# Patient Record
Sex: Female | Born: 1970 | Race: White | Hispanic: No | Marital: Married | Smoking: Never smoker
Health system: Southern US, Community
[De-identification: ages and names within clinical notes are randomized; demographics above are authoritative.]

## PROBLEM LIST (undated history)

## (undated) DIAGNOSIS — N979 Female infertility, unspecified: Secondary | ICD-10-CM

## (undated) DIAGNOSIS — R51 Headache: Secondary | ICD-10-CM

## (undated) DIAGNOSIS — B019 Varicella without complication: Secondary | ICD-10-CM

## (undated) DIAGNOSIS — R002 Palpitations: Secondary | ICD-10-CM

## (undated) DIAGNOSIS — R011 Cardiac murmur, unspecified: Secondary | ICD-10-CM

## (undated) DIAGNOSIS — K589 Irritable bowel syndrome without diarrhea: Secondary | ICD-10-CM

## (undated) DIAGNOSIS — M419 Scoliosis, unspecified: Secondary | ICD-10-CM

## (undated) DIAGNOSIS — I341 Nonrheumatic mitral (valve) prolapse: Secondary | ICD-10-CM

## (undated) HISTORY — DX: Palpitations: R00.2

## (undated) HISTORY — DX: Scoliosis, unspecified: M41.9

## (undated) HISTORY — DX: Female infertility, unspecified: N97.9

## (undated) HISTORY — DX: Cardiac murmur, unspecified: R01.1

## (undated) HISTORY — PX: WISDOM TOOTH EXTRACTION: SHX21

## (undated) HISTORY — DX: Irritable bowel syndrome, unspecified: K58.9

## (undated) HISTORY — PX: OTHER SURGICAL HISTORY: SHX169

## (undated) HISTORY — DX: Headache: R51

## (undated) HISTORY — DX: Varicella without complication: B01.9

## (undated) HISTORY — DX: Nonrheumatic mitral (valve) prolapse: I34.1

---

## 1997-09-24 ENCOUNTER — Ambulatory Visit (HOSPITAL_COMMUNITY): Admission: RE | Admit: 1997-09-24 | Discharge: 1997-09-24 | Payer: Self-pay | Admitting: Obstetrics and Gynecology

## 1997-12-01 ENCOUNTER — Encounter: Admission: RE | Admit: 1997-12-01 | Discharge: 1998-03-01 | Payer: Self-pay | Admitting: Gynecology

## 1998-01-15 ENCOUNTER — Inpatient Hospital Stay (HOSPITAL_COMMUNITY): Admission: AD | Admit: 1998-01-15 | Discharge: 1998-01-15 | Payer: Self-pay | Admitting: Obstetrics and Gynecology

## 1998-02-05 ENCOUNTER — Inpatient Hospital Stay: Admission: AD | Admit: 1998-02-05 | Discharge: 1998-02-05 | Payer: Self-pay | Admitting: Obstetrics and Gynecology

## 1999-02-08 ENCOUNTER — Other Ambulatory Visit: Admission: RE | Admit: 1999-02-08 | Discharge: 1999-02-08 | Payer: Self-pay | Admitting: Obstetrics and Gynecology

## 2000-03-29 ENCOUNTER — Other Ambulatory Visit: Admission: RE | Admit: 2000-03-29 | Discharge: 2000-03-29 | Payer: Self-pay | Admitting: Obstetrics and Gynecology

## 2001-04-17 ENCOUNTER — Other Ambulatory Visit: Admission: RE | Admit: 2001-04-17 | Discharge: 2001-04-17 | Payer: Self-pay | Admitting: Obstetrics and Gynecology

## 2002-11-17 ENCOUNTER — Other Ambulatory Visit: Admission: RE | Admit: 2002-11-17 | Discharge: 2002-11-17 | Payer: Self-pay | Admitting: Obstetrics and Gynecology

## 2004-07-26 ENCOUNTER — Other Ambulatory Visit: Admission: RE | Admit: 2004-07-26 | Discharge: 2004-07-26 | Payer: Self-pay | Admitting: Obstetrics and Gynecology

## 2011-11-20 ENCOUNTER — Encounter: Payer: Self-pay | Admitting: Family Medicine

## 2011-11-20 ENCOUNTER — Ambulatory Visit (INDEPENDENT_AMBULATORY_CARE_PROVIDER_SITE_OTHER): Payer: BC Managed Care – PPO | Admitting: Family Medicine

## 2011-11-20 VITALS — BP 118/78 | HR 98 | Temp 98.4°F | Ht 64.0 in | Wt 111.0 lb

## 2011-11-20 DIAGNOSIS — Z131 Encounter for screening for diabetes mellitus: Secondary | ICD-10-CM

## 2011-11-20 DIAGNOSIS — Z299 Encounter for prophylactic measures, unspecified: Secondary | ICD-10-CM

## 2011-11-20 DIAGNOSIS — Z23 Encounter for immunization: Secondary | ICD-10-CM

## 2011-11-20 DIAGNOSIS — Z13220 Encounter for screening for lipoid disorders: Secondary | ICD-10-CM

## 2011-11-20 LAB — HEMOGLOBIN A1C: Hgb A1c MFr Bld: 5.3 % (ref 4.6–6.5)

## 2011-11-20 NOTE — Progress Notes (Signed)
Chief Complaint  Patient presents with  . Establish Care    HPI: Debra Johnson is here to establish care.  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  Has the following concerns today: No regular exercise. Eats a well balanced and healthy diet.  Other Providers: Sees Dr. Marcelle Overlie in gyn every year and UTD on health maintenance.  -last pap about 1 year ago and all paps normal -had one mammogram and normal  Heart murmur, has had two echos and is normal.  Foot center - sees Dr. Charlsie Merles for plantar faciitis  Flu vaccine: -wants to get flu vaccine as a family with her daughter to help daughter -has not had tdap in 10 years  ROS: See pertinent positives and negatives per HPI.  Past Medical History  Diagnosis Date  . Chicken pox   . Headache     frequent  . Arrhythmia   . Heart murmur   . Scoliosis   . Infertility, female     Family History  Problem Relation Age of Onset  . Colon cancer Paternal Grandfather   . Lung cancer Maternal Grandfather   . Asthma Mother     History   Social History  . Marital Status: Single    Spouse Name: N/A    Number of Children: N/A  . Years of Education: N/A   Social History Main Topics  . Smoking status: Never Smoker   . Smokeless tobacco: None  . Alcohol Use: No  . Drug Use: No  . Sexually Active: Yes   Other Topics Concern  . None   Social History Narrative  . None    No current outpatient prescriptions on file.  EXAM:  Filed Vitals:   11/20/11 1432  BP: 118/78  Pulse: 98  Temp: 98.4 F (36.9 C)    Body mass index is 19.05 kg/(m^2).  GENERAL: vitals reviewed and listed above, alert, oriented, appears well hydrated and in no acute distress  HEENT: atraumatic, conjunttiva clear, no obvious abnormalities on inspection of external nose and ears  NECK: no obvious masses on inspection  LUNGS: clear to auscultation bilaterally, no wheezes, rales or rhonchi, good air movement  CV: HRRR, SEM, no  peripheral edema  MS: moves all extremities without noticeable abnormality  PSYCH: pleasant and cooperative, no obvious depression or anxiety  ASSESSMENT AND PLAN:  Discussed the following assessment and plan:  1. Preventive measure  Hemoglobin A1c, Lipid Panel   -We reviewed the PMH, PSH, FH, SH, Meds and Allergies. -We provided refills for any medications we will prescribe as needed. -We addressed current concerns per orders and patient instructions. -We have asked for records for pertinent exams, studies, vaccines and notes from previous providers. -We have advised patient to follow up per instructions below. -Tdap today, she will get flu vaccine in next several weeks  -Patient advised to return or notify a doctor immediately if symptoms worsen or persist or new concerns arise.  There are no Patient Instructions on file for this visit.   Kriste Basque R.

## 2011-11-20 NOTE — Patient Instructions (Addendum)
-  We have ordered labs or studies at this visit. It usually takes 1-2 weeks for results and processing. We will contact you with instructions IF your results are abnormal. Normal results will be released to your [facility] in 1-2 weeks. If you have not heard from [place] or can not find your results in [facility] in 2 weeks please contact our office.   -follow up in 1 year or sooner if concerns

## 2011-11-20 NOTE — Addendum Note (Signed)
Addended by: Azucena Freed on: 11/20/2011 03:21 PM   Modules accepted: Orders

## 2011-11-22 LAB — LIPID PANEL
Cholesterol: 179 mg/dL (ref 0–200)
HDL: 53.4 mg/dL (ref >39.00)
VLDL: 12.2 mg/dL (ref 0.0–40.0)

## 2012-02-26 ENCOUNTER — Telehealth: Payer: Self-pay | Admitting: Family Medicine

## 2012-02-26 DIAGNOSIS — R011 Cardiac murmur, unspecified: Secondary | ICD-10-CM

## 2012-02-26 NOTE — Telephone Encounter (Signed)
Please find out why she wants the referral, I know she has a hx of heart murmur and arrythmia and saw a cardiologist in the past. Is it for this or something new? If new symptoms should be seen. Referral placed.

## 2012-02-26 NOTE — Telephone Encounter (Signed)
Pls advise.  

## 2012-02-26 NOTE — Telephone Encounter (Signed)
Pt would like referral to cardiologist. Pt said this was discussed w/ MD at last visit.

## 2012-02-27 NOTE — Telephone Encounter (Signed)
Called and spoke with pt and pt states that she had two thing happen yesterday (01/28) that was new.  Pt states she had occ flutters where it seems like her heart stops beating for a few seconds.  Pt states during that time when it goes back to normal pt gets dizzy and lightheaded.  Advised pt that if this is something new Dr. Selena Batten needs to see pt for this.  Pt states she is wiling to come in on 02/28/12 and an appt was made.  Advised pt to go to ER if symptoms worsen or new symptoms occur.

## 2012-02-28 ENCOUNTER — Encounter: Payer: Self-pay | Admitting: Family Medicine

## 2012-02-28 ENCOUNTER — Telehealth: Payer: Self-pay | Admitting: *Deleted

## 2012-02-28 ENCOUNTER — Telehealth: Payer: Self-pay | Admitting: Family Medicine

## 2012-02-28 ENCOUNTER — Ambulatory Visit (INDEPENDENT_AMBULATORY_CARE_PROVIDER_SITE_OTHER): Payer: BC Managed Care – PPO | Admitting: Family Medicine

## 2012-02-28 VITALS — BP 110/80 | HR 103 | Temp 97.8°F | Wt 110.0 lb

## 2012-02-28 DIAGNOSIS — R079 Chest pain, unspecified: Secondary | ICD-10-CM

## 2012-02-28 DIAGNOSIS — R002 Palpitations: Secondary | ICD-10-CM | POA: Insufficient documentation

## 2012-02-28 LAB — CBC WITH DIFFERENTIAL/PLATELET
Eosinophils Relative: 2.5 % (ref 0.0–5.0)
HCT: 41.2 % (ref 36.0–46.0)
Hemoglobin: 13.9 g/dL (ref 12.0–15.0)
Lymphocytes Relative: 22.2 % (ref 12.0–46.0)
Lymphs Abs: 1 10*3/uL (ref 0.7–4.0)
Monocytes Relative: 6.4 % (ref 3.0–12.0)
Platelets: 197 10*3/uL (ref 150.0–400.0)
WBC: 4.7 10*3/uL (ref 4.5–10.5)

## 2012-02-28 LAB — COMPREHENSIVE METABOLIC PANEL
CO2: 25 mEq/L (ref 19–32)
Creatinine, Ser: 0.8 mg/dL (ref 0.4–1.2)
GFR: 84.52 mL/min (ref >60.00)
Glucose, Bld: 117 mg/dL — ABNORMAL HIGH (ref 70–99)
Total Bilirubin: 0.8 mg/dL (ref 0.3–1.2)
Total Protein: 7.1 g/dL (ref 6.0–8.3)

## 2012-02-28 NOTE — Telephone Encounter (Signed)
Labs look good.

## 2012-02-28 NOTE — Telephone Encounter (Signed)
30 day event monitor enrolled to be mailed to her home. 02/28/12 TK

## 2012-02-28 NOTE — Progress Notes (Addendum)
Chief Complaint  Patient presents with  . heart flutter and dizziness    HPI:  Acute visit for palpitations: -hx of heart murmur and arrythmia/palpitations and had 2 echos done - about 10 years ago -since then has had occ palpitations/fluttering of heart a few times per week - but never very bad -reports: 2 days ago and about 1 month ago had more severe episodes of heart fluttering and felt lightheaded with these episodes. Episodes lasted for a few seconds - but felt like she was going to past out. Both occurred at rest.  -never has symptoms during activity -denies: heavy menstrual bleeding, GI bleeding, fevers, chills, hearing issues, CP, SOB, DOE, vision changes, syncope -had an EKG in 7th grade and saw a pediatric cardiologist -was drinking a fair amount of caffeine before this episode, but has cut out all of this -no FH of heart problems or sudden death at a younger age  ROS: See pertinent positives and negatives per HPI.  Past Medical History  Diagnosis Date  . Chicken pox   . Headache     frequent  . Arrhythmia   . Heart murmur   . Scoliosis   . Infertility, female     Family History  Problem Relation Age of Onset  . Colon cancer Paternal Grandfather   . Lung cancer Maternal Grandfather   . Asthma Mother     History   Social History  . Marital Status: Single    Spouse Name: N/A    Number of Children: N/A  . Years of Education: N/A   Social History Main Topics  . Smoking status: Never Smoker   . Smokeless tobacco: None  . Alcohol Use: No  . Drug Use: No  . Sexually Active: Yes   Other Topics Concern  . None   Social History Narrative          Work or School: [employer]        Home Situation: has two adopted children       Spiritual Beliefs: methodist        Lifestyle: no regular exercise, heathy diet    No current outpatient prescriptions on file.  EXAM:  Filed Vitals:   02/28/12 0856  BP: 110/80  Pulse: 103  Temp: 97.8 F (36.6 C)    P 82 when I checked it and 88 on EKG There is no height on file to calculate BMI.  GENERAL: vitals reviewed and listed above, alert, oriented, appears well hydrated and in no acute distress  HEENT: atraumatic, conjunttiva clear, no obvious abnormalities on inspection of external nose and ears  NECK: no obvious masses on inspection  LUNGS: clear to auscultation bilaterally, no wheezes, rales or rhonchi, good air movement  CV: HRRR, SEM <>, no peripheral edema  MS: moves all extremities without noticeable abnormality  PSYCH: pleasant and cooperative, no obvious depression or anxiety  ASSESSMENT AND PLAN:  Discussed the following assessment and plan:  1. Palpitations  EKG 12-Lead, 2D Echocardiogram without contrast, TSH, CMP, CBC with Differential   -discussed case with cards as pt wanted to see them today due to anxiety about this. She had talked with her aunt who is a Restaurant manager, fast food and she had told her she needs to see cards.  Cards reviewed EKG that has some non-specific TW changes. Symptoms do not sound ischemic in nature. Will do Echo, cards will order monitor and get this to her, Will check CBC, CMP and TSH today. Pt will follow up  with cards next week or sooner if any new symptoms or worsening of symptoms. -Patient advised to return or notify a doctor immediately if symptoms worsen or persist or new concerns arise.  Patient Instructions  -someone from our office will let you know about the echo  -someone from the cardiology office will get a hold of you about the monitor  -please call the cardiology office if any new or worsening of symptoms prior to your appointment     Terressa Koyanagi.

## 2012-02-28 NOTE — Telephone Encounter (Signed)
DOD call from Dr Selena Batten stating pt having palpitations. Dr Elease Hashimoto requested 30 day monitor, order placed, monitor may be mailed,  Has app with Dr Jens Som this month for cardiac clearance.

## 2012-02-28 NOTE — Patient Instructions (Addendum)
-  someone from our office will let you know about the echo  -someone from the cardiology office will get a hold of you about the monitor  -please call the cardiology office if any new or worsening of symptoms prior to your appointment  -please continue to refrain from caffiene

## 2012-02-29 ENCOUNTER — Encounter: Payer: Self-pay | Admitting: Cardiology

## 2012-02-29 NOTE — Telephone Encounter (Signed)
Called and spoke with pt and pt is aware.  Pt states she saw this on MyChart.

## 2012-03-03 ENCOUNTER — Ambulatory Visit (INDEPENDENT_AMBULATORY_CARE_PROVIDER_SITE_OTHER): Payer: BC Managed Care – PPO | Admitting: Cardiology

## 2012-03-03 ENCOUNTER — Encounter: Payer: Self-pay | Admitting: Cardiology

## 2012-03-03 VITALS — BP 115/80 | HR 97 | Ht 64.0 in | Wt 111.0 lb

## 2012-03-03 DIAGNOSIS — R002 Palpitations: Secondary | ICD-10-CM

## 2012-03-03 DIAGNOSIS — R011 Cardiac murmur, unspecified: Secondary | ICD-10-CM

## 2012-03-03 NOTE — Progress Notes (Signed)
  HPI: 42 year old female for evaluation of palpitations. Laboratories in January of 2014 showed a TSH of 1.62, potassium 4.0, hemoglobin 13.9. Patient has had intermittent palpitations for years. She has also been told previously she had a murmur. Her palpitations are described as a brief flutter. They are not sustained. Recently she had 2 episodes followed by a pause. There was presyncope but she has not had syncope. She denies dyspnea on exertion, orthopnea, PND, pedal edema, exertional chest pain. Because of the above we are asked to evaluate.  No current outpatient prescriptions on file.    Allergies  Allergen Reactions  . Sulfa Antibiotics     Past Medical History  Diagnosis Date  . Chicken pox   . Headache     frequent  . Murmur   . Mitral valve prolapse   . Scoliosis   . Infertility, female   . IBS (irritable bowel syndrome)     No past surgical history on file.  History   Social History  . Marital Status: Married    Spouse Name: N/A    Number of Children: 2  . Years of Education: N/A   Occupational History  .      First grade teacher   Social History Main Topics  . Smoking status: Never Smoker   . Smokeless tobacco: Not on file  . Alcohol Use: No  . Drug Use: No  . Sexually Active: Yes   Other Topics Concern  . 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 Colon cancer Paternal Grandfather   . Lung cancer Maternal Grandfather   . Asthma Mother     ROS: no fevers or chills, productive cough, hemoptysis, dysphasia, odynophagia, melena, hematochezia, dysuria, hematuria, rash, seizure activity, orthopnea, PND, pedal edema, claudication. Remaining systems are negative.  Physical Exam:   Blood pressure 115/80, pulse 97, height 5\' 4"   (1.626 m), weight 111 lb (50.349 kg), last menstrual period 02/14/2012.  General:  Well developed/well nourished in NAD Skin warm/dry Patient not depressed No peripheral clubbing Back-normal HEENT-normal/normal eyelids Neck supple/normal carotid upstroke bilaterally; no bruits; no JVD; no thyromegaly chest - CTA/ normal expansion CV - RRR/normal S1 and S2; no rubs or gallops;  PMI nondisplaced, 2/6 systolic murmur left sternal border. Abdomen -NT/ND, no HSM, no mass, + bowel sounds, no bruit 2+ femoral pulses, no bruits Ext-no edema, chords, 2+ DP Neuro-grossly nonfocal  ECG 02/28/2011-sinus rhythm, right axis deviation, short PR interval and nonspecific T-wave changes.

## 2012-03-03 NOTE — Assessment & Plan Note (Signed)
Echocardiogram to further assess. Possible ejection murmur.

## 2012-03-03 NOTE — Patient Instructions (Addendum)
Your physician recommends that you schedule a follow-up appointment in: 8 WEEKS WITH DR CRENSHAW  

## 2012-03-03 NOTE — Assessment & Plan Note (Signed)
Symptoms sound potentially to be premature atrial contractions or premature ventricular contractions. She has a monitor in place. We will await those results. Echocardiogram to assess LV function. Recent TSH and potassium normal. If indeed she has premature beats causing her symptoms we will consider a beta blocker if her symptoms continue. She instructed to discontinue caffeine use.

## 2012-03-04 ENCOUNTER — Telehealth: Payer: Self-pay | Admitting: Family Medicine

## 2012-03-04 ENCOUNTER — Ambulatory Visit (HOSPITAL_COMMUNITY): Payer: BC Managed Care – PPO | Attending: Family Medicine

## 2012-03-04 DIAGNOSIS — I059 Rheumatic mitral valve disease, unspecified: Secondary | ICD-10-CM | POA: Insufficient documentation

## 2012-03-04 DIAGNOSIS — R002 Palpitations: Secondary | ICD-10-CM | POA: Insufficient documentation

## 2012-03-04 DIAGNOSIS — R011 Cardiac murmur, unspecified: Secondary | ICD-10-CM

## 2012-03-04 NOTE — Progress Notes (Signed)
Echocardiogram performed.  

## 2012-03-04 NOTE — Telephone Encounter (Signed)
Please let her know echo of heart looks good. Will forward results to Dr. Jens Som.

## 2012-03-05 NOTE — Telephone Encounter (Signed)
Called and left a message for pt to return call.  

## 2012-03-06 NOTE — Telephone Encounter (Signed)
Called and spoke with pt and pt is aware.  

## 2012-03-07 ENCOUNTER — Ambulatory Visit: Payer: BC Managed Care – PPO | Admitting: Cardiology

## 2012-03-15 ENCOUNTER — Other Ambulatory Visit: Payer: Self-pay

## 2012-04-14 ENCOUNTER — Telehealth: Payer: Self-pay | Admitting: *Deleted

## 2012-04-14 NOTE — Telephone Encounter (Signed)
Follow-up: ° ° ° °Patient called returning your call. Please call back. °

## 2012-04-14 NOTE — Telephone Encounter (Signed)
Spoke with pt, .aware of results.  

## 2012-04-14 NOTE — Telephone Encounter (Signed)
Left message for pt to call, monitor reviewed by dr Jens Som shows sinus with PVC's.

## 2012-04-29 ENCOUNTER — Encounter: Payer: Self-pay | Admitting: Cardiology

## 2012-04-29 ENCOUNTER — Ambulatory Visit (INDEPENDENT_AMBULATORY_CARE_PROVIDER_SITE_OTHER): Payer: BC Managed Care – PPO | Admitting: Cardiology

## 2012-04-29 VITALS — BP 115/80 | HR 77 | Ht 64.0 in | Wt 111.0 lb

## 2012-04-29 DIAGNOSIS — R002 Palpitations: Secondary | ICD-10-CM

## 2012-04-29 DIAGNOSIS — R011 Cardiac murmur, unspecified: Secondary | ICD-10-CM

## 2012-04-29 NOTE — Assessment & Plan Note (Signed)
Patient symptoms have improved. Monitor shows PVCs. LV function is normal. We discussed treatment with beta-blockade but she would prefer to avoid this if possible. We will consider in the future if her symptoms worsen.

## 2012-04-29 NOTE — Assessment & Plan Note (Signed)
Echocardiogram shows mild mitral regurgitation.

## 2012-04-29 NOTE — Progress Notes (Signed)
   HPI: Pleasant female I initially saw in Feb 2014 for evaluation of palpitations. Laboratories in January of 2014 showed a TSH of 1.62, potassium 4.0, hemoglobin 13.9. Echocardiogram in February of 2014 showed normal LV function and mild mitral regurgitation. Event monitor showed sinus rhythm with PVCs. Since I last saw her, the patient denies any dyspnea on exertion, orthopnea, PND, pedal edema, syncope or chest pain. Palpitations or slightly improved.    No current outpatient prescriptions on file.   No current facility-administered medications for this visit.     Past Medical History  Diagnosis Date  . Chicken pox   . Headache     frequent  . Murmur   . Mitral valve prolapse   . Scoliosis   . Infertility, female   . IBS (irritable bowel syndrome)     History reviewed. No pertinent past surgical history.  History   Social History  . Marital Status: Married    Spouse Name: N/A    Number of Children: 2  . Years of Education: N/A   Occupational History  .      First grade teacher   Social History Main Topics  . Smoking status: Never Smoker   . Smokeless tobacco: Not on file  . Alcohol Use: No  . Drug Use: No  . Sexually Active: Yes   Other Topics Concern  . 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 Remaining systems are negative.  Physical Exam: Well-developed well-nourished in no acute distress.  Skin is warm and dry.  HEENT is normal.  Neck is supple.  Chest is clear to auscultation with normal expansion.  Cardiovascular exam is regular rate and rhythm.    Abdominal exam nontender or distended. No masses palpated. Extremities show no edema. neuro grossly intact  ECG sinus rhythm, right axis deviation. Nonspecific ST changes.

## 2012-06-10 ENCOUNTER — Other Ambulatory Visit: Payer: BC Managed Care – PPO

## 2012-06-12 ENCOUNTER — Ambulatory Visit: Payer: BC Managed Care – PPO | Admitting: Cardiology

## 2012-09-16 ENCOUNTER — Telehealth: Payer: Self-pay | Admitting: Cardiology

## 2012-09-16 NOTE — Telephone Encounter (Signed)
New Problem   Pt states computers were down during her test results review// would like a call back to discuss.

## 2012-09-16 NOTE — Telephone Encounter (Signed)
Spoke with pt, monitor and echo results discussed. Questions answered.

## 2012-12-04 ENCOUNTER — Other Ambulatory Visit: Payer: Self-pay

## 2013-10-01 ENCOUNTER — Other Ambulatory Visit (HOSPITAL_COMMUNITY)
Admission: RE | Admit: 2013-10-01 | Discharge: 2013-10-01 | Disposition: A | Discharge status: Home / Self Care | Payer: BC Managed Care – PPO | Source: Ambulatory Visit | Attending: Family Medicine | Admitting: Family Medicine

## 2013-10-01 ENCOUNTER — Ambulatory Visit (INDEPENDENT_AMBULATORY_CARE_PROVIDER_SITE_OTHER): Payer: BC Managed Care – PPO | Admitting: Family Medicine

## 2013-10-01 ENCOUNTER — Encounter: Payer: Self-pay | Admitting: Family Medicine

## 2013-10-01 VITALS — BP 112/80 | HR 89 | Temp 98.0°F | Ht 64.25 in | Wt 109.0 lb

## 2013-10-01 DIAGNOSIS — I059 Rheumatic mitral valve disease, unspecified: Secondary | ICD-10-CM

## 2013-10-01 DIAGNOSIS — Z0000 Encounter for general adult medical examination without abnormal findings: Secondary | ICD-10-CM

## 2013-10-01 DIAGNOSIS — Z01419 Encounter for gynecological examination (general) (routine) without abnormal findings: Secondary | ICD-10-CM | POA: Diagnosis not present

## 2013-10-01 DIAGNOSIS — R002 Palpitations: Secondary | ICD-10-CM

## 2013-10-01 DIAGNOSIS — Z124 Encounter for screening for malignant neoplasm of cervix: Secondary | ICD-10-CM

## 2013-10-01 DIAGNOSIS — I340 Nonrheumatic mitral (valve) insufficiency: Secondary | ICD-10-CM

## 2013-10-01 DIAGNOSIS — Z1151 Encounter for screening for human papillomavirus (HPV): Secondary | ICD-10-CM | POA: Diagnosis present

## 2013-10-01 NOTE — Progress Notes (Signed)
No chief complaint on file.   HPI:  Here for CPE:  -Concerns and/or follow up today: none  1)Palpitations/Mitral regurg: -rare, intermittent -had CV evaluation, echo and monitor -saw cards, no worsening but wants my opinion on this -denies: CP, SOB, syncope, presyncope -active, no CV exercise  -Diet: variety of foods, balance and well rounded  -Exercise: regular CV exercise  -Taking folic acid, vitamin D or calcium: no  -Diabetes and Dyslipidemia Screening:  Full lab testing in the last year all was normal - reports diabetes and lipid screening done  -Hx of HTN: no  -Vaccines: UTD  -pap history: used to see Dr. Matthew Saras, all paps normal, last was 3 years ago  -FDLMP: Aug 24th, normal, regular  -sexual activity: yes, female partner, no new partners  -wants STI testing: no  -FH breast, colon or ovarian ca: see FH Last mammogram: several years ago Last colon cancer screening: N/A  Breast Ca Risk Assessment: -SolutionApps.it -no FH breast or ovarian ca, normal mammograms in the past  -Alcohol, Tobacco, drug use: see social history  Review of Systems - no fevers, unintentional weight loss, vision loss, hearing loss, chest pain, sob, hemoptysis, melena, hematochezia, hematuria, genital discharge, changing or concerning skin lesions, bleeding, bruising, loc, thoughts of self harm or SI  Past Medical History  Diagnosis Date  . Chicken pox   . Headache(784.0)     frequent  . Murmur   . Mitral valve prolapse   . Scoliosis   . Infertility, female   . IBS (irritable bowel syndrome)     No past surgical history on file.  Family History  Problem Relation Age of Onset  . Colon cancer Paternal Grandfather   . Lung cancer Maternal Grandfather   . Asthma Mother     History   Social History  . Marital Status: Married    Spouse Name: N/A    Number of Children: 2  . Years of Education: N/A   Occupational History  .      First grade teacher    Social History Main Topics  . Smoking status: Never Smoker   . Smokeless tobacco: None  . Alcohol Use: No  . Drug Use: No  . Sexual Activity: Yes   Other Topics Concern  . None   Social History Narrative          Work or School: [employer]              Home Situation: has two adopted children             Spiritual Beliefs: methodist              Lifestyle: no regular exercise, heathy diet                No current outpatient prescriptions on file.  EXAM:  Filed Vitals:   10/01/13 0931  BP: 112/80  Pulse: 89  Temp: 98 F (36.7 C)    GENERAL: vitals reviewed and listed below, alert, oriented, appears well hydrated and in no acute distress  HEENT: head atraumatic, PERRLA, normal appearance of eyes, ears, nose and mouth. moist mucus membranes.  NECK: supple, no masses or lymphadenopathy  LUNGS: clear to auscultation bilaterally, no rales, rhonchi or wheeze  CV: HRRR, no peripheral edema or cyanosis, normal pedal pulses  BREAST: normal appearance - no lesions or discharge, on palpation normal breast tissue without any suspicious masses  ABDOMEN: bowel sounds normal, soft, non tender to palpation, no masses,  no rebound or guarding  GU: normal appearance of external genitalia - no lesions or masses, normal vaginal mucosa - no abnormal discharge, normal appearance of cervix - no lesions or abnormal discharge, no masses or tenderness on palpation of uterus and ovaries.  RECTAL: refused  SKIN: no rash or abnormal lesions  MS: normal gait, moves all extremities normally  NEURO: CN II-XII grossly intact, normal muscle strength and sensation to light touch on extremities  PSYCH: normal affect, pleasant and cooperative  ASSESSMENT AND PLAN:  Discussed the following assessment and plan:  1. Annual physical exam -Discussed and advised all [place] preventive services health task force level A and B recommendations for age, sex and risks.  -pap  today  -Advised at least 150 minutes of exercise per week and a healthy diet low in saturated fats and sweets and consisting of fresh fruits and vegetables, lean meats such as fish and white chicken and whole grains.  Declined flu  -labs, studies and vaccines per orders this encounter  2. Palpitations Reassurance, discussed triggers, what to do if worsening, etc  3. Mitral regurgitation Discussed implications  4. Cervical cancer screening Discussed, pap today  -Discussed and advised all [place] preventive services health task force level A and B recommendations for age, sex and risks.  -pap today  -Advised at least 150 minutes of exercise per week and a healthy diet low in saturated fats and sweets and consisting of fresh fruits and vegetables, lean meats such as fish and white chicken and whole grains.  -labs, studies and vaccines per orders this encounter  No orders of the defined types were placed in this encounter.    Patient advised to return to clinic immediately if symptoms worsen or persist or new concerns.  Patient Instructions  -Vitamin D3 [phone] IU daily; total of 1200mg  of calcium daily from diet + or - supplement if needed  -We have completed a pap smear today. It can take up to 1-2 weeks for results and processing. We will contact you with instructions IF your results are abnormal. Normal results will be released to your [facility]. If you have not heard from [place] or can not find your results in [facility] in 2 weeks please contact our office.  -PLEASE SIGN UP FOR MYCHART TODAY   We recommend the following healthy lifestyle measures: - eat a healthy diet consisting of lots of vegetables, fruits, beans, nuts, seeds, healthy meats such as white chicken and fish and whole grains.  - avoid fried foods, fast food, processed foods, sodas, red meet and other fattening foods.  - get a least 150 minutes of aerobic exercise per week.   -call to schedule mammogram  Follow up in: 1  year or as needed     Return in about 1 year (around 10/02/2014), or if symptoms worsen or fail to improve, for CPE.  Colin Benton R.

## 2013-10-01 NOTE — Addendum Note (Signed)
Addended by: Agnes Lawrence on: 10/01/2013 10:05 AM   Modules accepted: Orders

## 2013-10-01 NOTE — Progress Notes (Signed)
Pre visit review using our clinic review tool, if applicable. No additional management support is needed unless otherwise documented below in the visit note. 

## 2013-10-01 NOTE — Patient Instructions (Signed)
-  Vitamin D3 [phone] IU daily; total of 1200mg  of calcium daily from diet + or - supplement if needed  -We have completed a pap smear today. It can take up to 1-2 weeks for results and processing. We will contact you with instructions IF your results are abnormal. Normal results will be released to your [facility]. If you have not heard from [place] or can not find your results in [facility] in 2 weeks please contact our office.  -PLEASE SIGN UP FOR MYCHART TODAY   We recommend the following healthy lifestyle measures: - eat a healthy diet consisting of lots of vegetables, fruits, beans, nuts, seeds, healthy meats such as white chicken and fish and whole grains.  - avoid fried foods, fast food, processed foods, sodas, red meet and other fattening foods.  - get a least 150 minutes of aerobic exercise per week.   -call to schedule mammogram  Follow up in: 1 year or as needed

## 2013-10-02 LAB — CYTOLOGY - PAP

## 2013-11-27 ENCOUNTER — Encounter: Payer: Self-pay | Admitting: Family Medicine

## 2013-11-30 ENCOUNTER — Encounter: Payer: Self-pay | Admitting: Family Medicine

## 2014-01-27 ENCOUNTER — Encounter: Payer: Self-pay | Admitting: Family Medicine

## 2014-01-27 ENCOUNTER — Ambulatory Visit (INDEPENDENT_AMBULATORY_CARE_PROVIDER_SITE_OTHER): Payer: BC Managed Care – PPO | Admitting: Family Medicine

## 2014-01-27 VITALS — BP 108/64 | HR 92 | Temp 97.9°F | Ht 64.25 in | Wt 108.1 lb

## 2014-01-27 DIAGNOSIS — M546 Pain in thoracic spine: Secondary | ICD-10-CM

## 2014-01-27 DIAGNOSIS — M549 Dorsalgia, unspecified: Secondary | ICD-10-CM

## 2014-01-27 DIAGNOSIS — M419 Scoliosis, unspecified: Secondary | ICD-10-CM

## 2014-01-27 NOTE — Progress Notes (Signed)
Pre visit review using our clinic review tool, if applicable. No additional management support is needed unless otherwise documented below in the visit note. 

## 2014-01-27 NOTE — Patient Instructions (Signed)
BEFORE YOU LEAVE: -follow up in 4 weeks -upper back exercises  Heat for 15 minutes twice daily Exercises 4 days per week Tylenol 500-1000mg  up to 3 times daily if needed for pain - topical menthol and capsaicin sports creams can help too Follow up in 4 weeks

## 2014-01-27 NOTE — Progress Notes (Signed)
  HPI:  Acute visit for:  R Shoulder Pain: -started 1 week ago after carrying nephew all day and installing ceiling fan -location/symptoms: R traps region, mod pain, worse with certain positions, better with tylenol and heat -denies: fevers, malaise, radiation to UE, weakness, numbness -hx sig scoliosis since childhood - saw specialist as a child and was in a brace at night for a few years  ROS: See pertinent positives and negatives per HPI.  Past Medical History  Diagnosis Date  . Chicken pox   . Headache(784.0)     frequent  . Murmur   . Mitral valve prolapse   . Scoliosis   . Infertility, female   . IBS (irritable bowel syndrome)     No past surgical history on file.  Family History  Problem Relation Age of Onset  . Colon cancer Paternal Grandfather   . Lung cancer Maternal Grandfather   . Asthma Mother     History   Social History  . Marital Status: Married    Spouse Name: N/A    Number of Children: 2  . Years of Education: N/A   Occupational History  .      First grade teacher   Social History Main Topics  . Smoking status: Never Smoker   . Smokeless tobacco: None  . Alcohol Use: No  . Drug Use: No  . Sexual Activity: Yes   Other Topics Concern  . None   Social History Narrative          Work or School: [employer]              Home Situation: has two adopted children             Spiritual Beliefs: methodist              Lifestyle: no regular exercise, heathy diet                No current outpatient prescriptions on file.  EXAM:  Filed Vitals:   01/27/14 1015  BP: 108/64  Pulse: 92  Temp: 97.9 F (36.6 C)    Body mass index is 18.41 kg/(m^2).  GENERAL: vitals reviewed and listed above, alert, oriented, appears well hydrated and in no acute distress  HEENT: atraumatic, conjunttiva clear, no obvious abnormalities on inspection of external nose and ears  NECK: no obvious masses on inspection  LUNGS: clear to  auscultation bilaterally, no wheezes, rales or rhonchi, good air movement  CV: HRRR, no peripheral edema  MS: moves all extremities without noticeable abnormality Scoliosis TTP in R trap muscles No bony TTP Normal strength and sensation to light touch in UE bilaterally  PSYCH: pleasant and cooperative, no obvious depression or anxiety  ASSESSMENT AND PLAN:  Discussed the following assessment and plan:  Upper back pain  Scoliosis  -more likely muscular given hx and exam -opted for HEP and conservative measures -consider ortho referral if persistent or worsening -Patient advised to return or notify a doctor immediately if symptoms worsen or persist or new concerns arise.  Patient Instructions  BEFORE YOU LEAVE: -follow up in 4 weeks -upper back exercises  Heat for 15 minutes twice daily Exercises 4 days per week Tylenol 500-1000mg  up to 3 times daily if needed for pain - topical menthol and capsaicin sports creams can help too Follow up in 4 weeks     Sukhman Martine R.

## 2014-02-17 ENCOUNTER — Encounter: Payer: Self-pay | Admitting: Cardiology

## 2014-02-17 ENCOUNTER — Encounter: Payer: Self-pay | Admitting: *Deleted

## 2014-02-17 ENCOUNTER — Ambulatory Visit (INDEPENDENT_AMBULATORY_CARE_PROVIDER_SITE_OTHER): Payer: BC Managed Care – PPO | Admitting: Cardiology

## 2014-02-17 DIAGNOSIS — R002 Palpitations: Secondary | ICD-10-CM

## 2014-02-17 DIAGNOSIS — R011 Cardiac murmur, unspecified: Secondary | ICD-10-CM

## 2014-02-17 MED ORDER — METOPROLOL SUCCINATE ER 25 MG PO TB24: 25.0000 mg | ORAL_TABLET | Freq: Every day | ORAL | Status: DC

## 2014-02-17 NOTE — Patient Instructions (Signed)
Your physician recommends that you schedule a follow-up appointment in: [place] has requested that you have an echocardiogram. Echocardiography is a painless test that uses sound waves to create images of your heart. It provides your doctor with information about the size and shape of your heart and how well your heart's chambers and valves are working. This procedure takes approximately one hour. There are no restrictions for this procedure.   START METOPROLOL SUCC ER 25 MG ONCE DAILY AT BEDTIME

## 2014-02-17 NOTE — Assessment & Plan Note (Signed)
Previous monitor showed PVCs. LV function previously normal. Repeat echocardiogram. I have explained that these are benign in the setting of normal LV function but she is symptomatic. She would like to try Toprol 25 mg daily to see if this improves her symptoms.

## 2014-02-17 NOTE — Progress Notes (Signed)
      HPI: Pleasant female I initially saw in Feb 2014 for evaluation of palpitations. Laboratories in January of 2014 showed a TSH of 1.62, potassium 4.0, hemoglobin 13.9. Echocardiogram in February of 2014 showed normal LV function and mild mitral regurgitation. Event monitor showed sinus rhythm with PVCs. Since I last saw her, the patient denies any dyspnea on exertion, orthopnea, PND, pedal edema, syncope or chest pain. She has had worsening palpitations. They're described as a skip. Not sustained.  Current Outpatient Prescriptions  Medication Sig Dispense Refill  . acetaminophen (TYLENOL) 500 MG tablet Take 500 mg by mouth every 6 (six) hours as needed for headache.     No current facility-administered medications for this visit.     Past Medical History  Diagnosis Date  . Chicken pox   . Headache(784.0)     frequent  . Murmur   . Mitral valve prolapse   . Scoliosis   . Infertility, female   . IBS (irritable bowel syndrome)     History reviewed. No pertinent past surgical history.  History   Social History  . Marital Status: Married    Spouse Name: N/A    Number of Children: 2  . Years of Education: N/A   Occupational History  .      First grade teacher   Social History Main Topics  . Smoking status: Never Smoker   . Smokeless tobacco: Not on file  . Alcohol Use: No  . Drug Use: No  . Sexual Activity: Yes   Other Topics Concern  . Not on file   Social History Narrative          Work or School: [employer]              Home Situation: has two adopted children             Spiritual Beliefs: methodist              Lifestyle: no regular exercise, heathy diet                ROS: no fevers or chills, productive cough, hemoptysis, dysphasia, odynophagia, melena, hematochezia, dysuria, hematuria, rash, seizure activity, orthopnea, PND, pedal edema, claudication. Remaining systems are negative.  Physical Exam: Well-developed well-nourished in no  acute distress.  Skin is warm and dry.  HEENT is normal.  Neck is supple.  Chest is clear to auscultation with normal expansion.  Cardiovascular exam is regular rate and rhythm. 1/6 systolic ejection murmur Abdominal exam nontender or distended. No masses palpated. Extremities show no edema. neuro grossly intact  ECG normal sinus rhythm, right axis deviation, nonspecific ST changes.

## 2014-02-17 NOTE — Assessment & Plan Note (Signed)
Patient is having worsening palpitations.also noted to have a murmur. Reassess LV function with echocardiogram.

## 2014-02-23 ENCOUNTER — Ambulatory Visit (HOSPITAL_COMMUNITY)
Admission: RE | Admit: 2014-02-23 | Discharge: 2014-02-23 | Disposition: A | Discharge status: Home / Self Care | Payer: BLUE CROSS/BLUE SHIELD | Source: Ambulatory Visit | Attending: Cardiology | Admitting: Cardiology

## 2014-02-23 DIAGNOSIS — R011 Cardiac murmur, unspecified: Secondary | ICD-10-CM | POA: Diagnosis not present

## 2014-02-23 DIAGNOSIS — R002 Palpitations: Secondary | ICD-10-CM

## 2014-02-23 NOTE — Progress Notes (Signed)
2D Echo Performed 02/23/2014    Marygrace Drought, RCS

## 2014-02-24 ENCOUNTER — Encounter: Payer: Self-pay | Admitting: Cardiology

## 2014-02-24 NOTE — Telephone Encounter (Signed)
This encounter was created in error - please disregard.

## 2014-02-24 NOTE — Telephone Encounter (Signed)
New message ° ° ° °Returning Debra Johnson's call °

## 2014-05-17 NOTE — Progress Notes (Signed)
      [id number] female I initially saw in Feb 2014 for evaluation of palpitations. Laboratories in January of 2014 showed a TSH of 1.62, potassium 4.0, hemoglobin 13.9. Echocardiogram in February of 2014 showed normal LV function and mild mitral regurgitation. Event monitor showed sinus rhythm with PVCs. Echocardiogram repeated January 2016 and showed normal LV function, mild mitral valve prolapse and mild mitral regurgitation. Toprol added at last ov. Since I last saw her, no dyspnea, chest pain or syncope. She continues to have palpitations worse during the day. They are overall improved.  Current Outpatient Prescriptions  Medication Sig Dispense Refill  . acetaminophen (TYLENOL) 500 MG tablet Take 500 mg by mouth every 6 (six) hours as needed for headache.    . metoprolol succinate (TOPROL XL) 25 MG 24 hr tablet Take 1 tablet (25 mg total) by mouth daily. 90 tablet 3   No current facility-administered medications for this visit.     Past Medical History  Diagnosis Date  . Chicken pox   . Headache(784.0)     frequent  . Murmur   . Mitral valve prolapse   . Scoliosis   . Infertility, female   . IBS (irritable bowel syndrome)     History reviewed. No pertinent past surgical history.  History   Social History  . Marital Status: Married    Spouse Name: N/A  . Number of Children: 2  . Years of Education: N/A   Occupational History  .      First grade teacher   Social History Main Topics  . Smoking status: Never Smoker   . Smokeless tobacco: Not on file  . Alcohol Use: No  . Drug Use: No  . Sexual Activity: Yes   Other Topics Concern  . Not on file   Social History Narrative          Work or School: [employer]              Home Situation: has two adopted children             Spiritual Beliefs: methodist              Lifestyle: no regular exercise, heathy diet                ROS: no fevers or chills, productive cough, hemoptysis, dysphasia,  odynophagia, melena, hematochezia, dysuria, hematuria, rash, seizure activity, orthopnea, PND, pedal edema, claudication. Remaining systems are negative.  Physical Exam: Well-developed well-nourished in no acute distress.  Skin is warm and dry.  HEENT is normal.  Neck is supple.  Chest is clear to auscultation with normal expansion.  Cardiovascular exam is regular rate and rhythm.  Abdominal exam nontender or distended. No masses palpated. Extremities show no edema. neuro grossly intact

## 2014-05-19 ENCOUNTER — Ambulatory Visit (INDEPENDENT_AMBULATORY_CARE_PROVIDER_SITE_OTHER): Payer: BLUE CROSS/BLUE SHIELD | Admitting: Cardiology

## 2014-05-19 ENCOUNTER — Encounter: Payer: Self-pay | Admitting: Cardiology

## 2014-05-19 ENCOUNTER — Ambulatory Visit: Payer: BC Managed Care – PPO | Admitting: Cardiology

## 2014-05-19 VITALS — BP 122/68 | HR 92 | Ht 65.25 in | Wt 114.0 lb

## 2014-05-19 DIAGNOSIS — R002 Palpitations: Secondary | ICD-10-CM

## 2014-05-19 NOTE — Patient Instructions (Signed)
Your physician wants you to follow-up in: [place] will receive a reminder letter in the mail two months in advance. If you don't receive a letter, please call our office to schedule the follow-up appointment.   CHANGE METOPROLOL TO AM DOSING INSTEAD OF EVENING

## 2014-05-19 NOTE — Assessment & Plan Note (Signed)
Felt to be secondary to PACs and PVCs. Worse during the day. Change Toprol to 25 mg in the morning instead of at night. We can increase to 50 mg in the future if symptoms worsen. She would like to avoid higher doses at this point as her blood pressure sometimes runs low. Echocardiogram shows normal LV function.

## 2014-07-25 ENCOUNTER — Encounter (HOSPITAL_COMMUNITY): Payer: Self-pay | Admitting: *Deleted

## 2014-07-25 ENCOUNTER — Emergency Department (HOSPITAL_COMMUNITY)
Admission: EM | Admit: 2014-07-25 | Discharge: 2014-07-25 | Disposition: A | Discharge status: Home / Self Care | Payer: BC Managed Care – PPO | Attending: Emergency Medicine | Admitting: Emergency Medicine

## 2014-07-25 DIAGNOSIS — Z8679 Personal history of other diseases of the circulatory system: Secondary | ICD-10-CM | POA: Diagnosis not present

## 2014-07-25 DIAGNOSIS — R011 Cardiac murmur, unspecified: Secondary | ICD-10-CM | POA: Diagnosis not present

## 2014-07-25 DIAGNOSIS — Z8719 Personal history of other diseases of the digestive system: Secondary | ICD-10-CM | POA: Insufficient documentation

## 2014-07-25 DIAGNOSIS — K047 Periapical abscess without sinus: Secondary | ICD-10-CM | POA: Diagnosis not present

## 2014-07-25 DIAGNOSIS — Z79899 Other long term (current) drug therapy: Secondary | ICD-10-CM | POA: Insufficient documentation

## 2014-07-25 DIAGNOSIS — K088 Other specified disorders of teeth and supporting structures: Secondary | ICD-10-CM | POA: Diagnosis present

## 2014-07-25 DIAGNOSIS — Z8619 Personal history of other infectious and parasitic diseases: Secondary | ICD-10-CM | POA: Insufficient documentation

## 2014-07-25 DIAGNOSIS — M419 Scoliosis, unspecified: Secondary | ICD-10-CM | POA: Insufficient documentation

## 2014-07-25 MED ORDER — AMOXICILLIN 250 MG/5ML PO SUSR: 500.0000 mg | Freq: Three times a day (TID) | ORAL | Status: AC

## 2014-07-25 MED ORDER — AMOXICILLIN 250 MG/5ML PO SUSR
500.0000 mg | Freq: Once | ORAL | Status: AC
  Administered 2014-07-25: 500 mg via ORAL
  Filled 2014-07-25: qty 10

## 2014-07-25 MED ORDER — LIDOCAINE VISCOUS 2 % MT SOLN
15.0000 mL | Freq: Once | OROMUCOSAL | Status: AC
  Administered 2014-07-25: 15 mL via OROMUCOSAL
  Filled 2014-07-25: qty 15

## 2014-07-25 MED ORDER — AMOXICILLIN 500 MG PO CAPS
500.0000 mg | ORAL_CAPSULE | Freq: Once | ORAL | Status: DC
  Filled 2014-07-25: qty 1

## 2014-07-25 NOTE — ED Notes (Addendum)
Pain and swelling in right lower jaw Thursday (described as spasms).  Went to urgent care in [place] and they told her it was an irritated nerve.    Now describes as a dull ache.  Attempted to call dentist with no return call.  She has had a root canal on a tooth in that area.  She thinks she may need a root canal in that tooth.  But not sure if this is a dental issue or something else going on.  Denies fever, nausea and vomiting.  (urgent care prescribed hydrocodone - pt has med with her; they also prescribed zofran which she has with her as well.  Last dose of pain med 2:30 am.  Rates pain 8/10.  When she takes the pain medicine, it does seem to help

## 2014-07-25 NOTE — ED Notes (Signed)
MD at bedside - Dr. Vanita Panda

## 2014-07-25 NOTE — ED Provider Notes (Signed)
CSN: [id number]     Arrival date & time 07/25/14  8921 History   First MD Initiated Contact with Patient 07/25/14 [phone]     Chief Complaint  Patient presents with  . Dental Pain     (Consider location/radiation/quality/duration/timing/severity/associated sxs/prior Treatment) HPI Patient presents with concern of increasing pain, swelling about her right lower jaw. Symptoms began about 3 days ago, since onset has been progressive, with increased swelling, pain focally in the right lower jaw, though with some radiation towards the TMJ on the ipsilateral side. No new fever, chills, difficulty swallowing, speaking, breathing. No fever, vomiting. Patient's urgent care 2 days ago, was started on pain medication, antiemetics, not antibiotics. Since that time the pain has been transiently controlled with the analgesia 6, though the swelling has increased. Patient has known dental issues on that area, including one, 2 cavities.  Past Medical History  Diagnosis Date  . Chicken pox   . Headache(784.0)     frequent  . Murmur   . Mitral valve prolapse   . Scoliosis   . Infertility, female   . IBS (irritable bowel syndrome)    History reviewed. No pertinent past surgical history. Family History  Problem Relation Age of Onset  . Colon cancer Paternal Grandfather   . Lung cancer Maternal Grandfather   . Asthma Mother    History  Substance Use Topics  . Smoking status: Never Smoker   . Smokeless tobacco: Not on file  . Alcohol Use: No   OB History    Gravida Para Term Preterm AB TAB SAB Ectopic Multiple Living   1 0             Review of Systems  Constitutional: Negative for fever.  HENT: Positive for dental problem.   Respiratory: Negative for shortness of breath.   Cardiovascular: Negative for chest pain.  Musculoskeletal:       Negative aside from HPI  Skin:       Negative aside from HPI  Allergic/Immunologic: Negative for immunocompromised state.  Neurological: Negative for  weakness.      Allergies  Sulfa antibiotics  Home Medications   Prior to Admission medications   Medication Sig Start Date End Date Taking? Authorizing Provider  acetaminophen (TYLENOL) 500 MG tablet Take 500 mg by mouth every 6 (six) hours as needed for headache.    Historical Provider, MD  metoprolol succinate (TOPROL XL) 25 MG 24 hr tablet Take 1 tablet (25 mg total) by mouth daily. 02/17/14   Lelon Perla, MD   BP 133/83 mmHg  Pulse 102  Temp(Src) 98.9 F (37.2 C) (Oral)  Resp 16  SpO2 100%  LMP 07/15/2014 (Exact Date) Physical Exam  Constitutional: She is oriented to person, place, and time. She appears well-developed and well-nourished. No distress.  HENT:  Head: Normocephalic and atraumatic.  Mouth/Throat: Uvula is midline. No trismus in the jaw. No uvula swelling. No oropharyngeal exudate, posterior oropharyngeal edema, posterior oropharyngeal erythema or tonsillar abscesses.    Eyes: Conjunctivae and EOM are normal.  Neck: No tracheal tenderness present. No tracheal deviation and no erythema present.  Cardiovascular: Normal rate and regular rhythm.   Pulmonary/Chest: Effort normal. No stridor. No respiratory distress.  Musculoskeletal: She exhibits no edema.  Neurological: She is alert and oriented to person, place, and time. No cranial nerve deficit.  Skin: Skin is warm and dry.  Psychiatric: She has a normal mood and affect.  Nursing note and vitals reviewed.   ED Course  Procedures (including  critical care time) Patient had pain with initiation of antibiotics, and topical medication here. Topical medication did not provide substantial relief, on discharge she will receive antibiotics, continue analgesia 6, antiemetics, will follow-up with dental tomorrow. She has a Pharmacist, community.   MDM  Patient presents with ongoing facial discomfort. No evidence for respiratory, mask, sepsis, deep space abscess. Patient does have some physical exam findings consistent with  dental infection Patient is a dentist with whom she'll follow up tomorrow, was provided antibiotics as well as analgesia, discharged in stable condition.  Carmin Muskrat, MD 07/25/14 [phone]

## 2014-07-25 NOTE — Discharge Instructions (Signed)
As discussed, it is important that you follow up with your dentist tomorrow for continued management of your condition.  If you develop any new, or concerning changes in your condition, please return to the emergency department immediately.

## 2014-07-25 NOTE — ED Notes (Signed)
Pt reports lower right jaw dental pain since Thursday. Pt went to urgent care over weekend, provider thought pt had an irritated nerve, pt was prescribed vicodin and zofran. Last dose of vicodin at 0230. Now pt reports there is 1 tooth in particular that hurts the most with some swelling at the site. Pain 8/10. Pt has been unable to reach dentist.

## 2015-02-02 ENCOUNTER — Other Ambulatory Visit: Payer: Self-pay | Admitting: Cardiology

## 2015-02-02 NOTE — Telephone Encounter (Signed)
Rx request sent to pharmacy.  

## 2015-04-22 ENCOUNTER — Telehealth: Payer: Self-pay | Admitting: Cardiology

## 2015-04-22 NOTE — Telephone Encounter (Signed)
°  Follow Up   Pt is calling in requesting to speak to a nurse again. Please call.

## 2015-04-22 NOTE — Telephone Encounter (Signed)
No answer/phone disconnects/air drops when dialed.

## 2015-04-22 NOTE — Telephone Encounter (Signed)
Returned call to patient.  She states when seeing a periodontist last summer and when seeing her dentist today, she had injections of epinephrine for procedures.  She noted that both times, her heart "beat funny", she had fluttering. Episode today lasted about 5 mins.  She has further dental work scheduled for April 10th, and wanted advice/any precautions going forward.  It was explained to her by dentist that this is a known effect of the medication, and that they could make a note in her chart to use alternatives. I affirmed this - stated Dr. Stanford Breed may just make recommendation to avoid epinephrine for procedures or advise if OK.  Pt voiced understanding and thanks.

## 2015-04-22 NOTE — Telephone Encounter (Signed)
New Message  Pt requested to speak w/ rN concerning some issues she has had w/ palp while she has been @ the dentist office. Please call back and discuss.

## 2015-04-23 NOTE — Telephone Encounter (Signed)
Agree with avoiding epinephrine Kirk Ruths

## 2015-04-25 NOTE — Telephone Encounter (Signed)
Pt given recommendations and voiced understanding.

## 2015-04-28 ENCOUNTER — Encounter: Payer: Self-pay | Admitting: Family Medicine

## 2015-04-28 ENCOUNTER — Ambulatory Visit (INDEPENDENT_AMBULATORY_CARE_PROVIDER_SITE_OTHER): Payer: BC Managed Care – PPO | Admitting: Family Medicine

## 2015-04-28 VITALS — BP 112/80 | HR 78 | Temp 97.7°F | Ht 65.25 in | Wt 124.3 lb

## 2015-04-28 DIAGNOSIS — M7711 Lateral epicondylitis, right elbow: Secondary | ICD-10-CM

## 2015-04-28 DIAGNOSIS — M79606 Pain in leg, unspecified: Secondary | ICD-10-CM | POA: Diagnosis not present

## 2015-04-28 MED ORDER — NAPROXEN 500 MG PO TABS: 500.0000 mg | ORAL_TABLET | Freq: Two times a day (BID) | ORAL | Status: DC

## 2015-04-28 NOTE — Progress Notes (Signed)
Pre visit review using our clinic review tool, if applicable. No additional management support is needed unless otherwise documented below in the visit note. 

## 2015-04-28 NOTE — Patient Instructions (Addendum)
Before you leave: -flu shot? -Hamstring exercises -Tennis elbow exercises -Schedule follow-up in 4 weeks  Take the naproxen as instructed  Ice or heat to the elbow twice daily for 15 minutes  Do the exercises at least 4 days per week

## 2015-04-28 NOTE — Progress Notes (Signed)
HPI:  Debra Johnson is a pleasant 45 year old here for an acute visit for several issues. She reports some bilateral posterior knee pain for several weeks. This is mild, is not interfering with activity and occurs when standing flat on her feet for long periods of time. She has recently changed from shoes with heels to very flat Toms. She also has diagnosed herself with right tennis elbow. She reports pain in the extensor tendons at the attachment the elbow for about 1-2 months. She does not play any sports such as tennis or racquetball. She denies malaise, fevers, joint pains or swelling. She denies weakness, numbness or vascular issues.   ROS: See pertinent positives and negatives per HPI.  Past Medical History  Diagnosis Date  . Chicken pox   . Headache(784.0)     frequent  . Murmur   . Mitral valve prolapse   . Scoliosis   . Infertility, female   . IBS (irritable bowel syndrome)     No past surgical history on file.  Family History  Problem Relation Age of Onset  . Colon cancer Paternal Grandfather   . Lung cancer Maternal Grandfather   . Asthma Mother     Social History   Social History  . Marital Status: Married    Spouse Name: N/A  . Number of Children: 2  . Years of Education: N/A   Occupational History  .      First grade teacher   Social History Main Topics  . Smoking status: Never Smoker   . Smokeless tobacco: None  . Alcohol Use: No  . Drug Use: No  . Sexual Activity: Yes   Other Topics Concern  . None   Social History Narrative          Work or School: [employer]              Home Situation: has two adopted children             Spiritual Beliefs: methodist              Lifestyle: no regular exercise, heathy diet                 Current outpatient prescriptions:  .  HYDROcodone-acetaminophen (NORCO/VICODIN) 5-325 MG per tablet, Take 1 tablet by mouth every 6 (six) hours as needed for moderate pain., Disp: , Rfl:  .  ibuprofen  (ADVIL,MOTRIN) 200 MG tablet, Take 200 mg by mouth every 6 (six) hours as needed for moderate pain., Disp: , Rfl:  .  metoprolol succinate (TOPROL-XL) 25 MG 24 hr tablet, TAKE ONE TABLET BY MOUTH ONE TIME DAILY, Disp: 90 tablet, Rfl: 1 .  naproxen (NAPROSYN) 500 MG tablet, Take 1 tablet (500 mg total) by mouth 2 (two) times daily with a meal., Disp: 28 tablet, Rfl: 0  EXAM:  Filed Vitals:   04/28/15 1432  BP: 112/80  Pulse: 78  Temp: 97.7 F (36.5 C)    Body mass index is 20.54 kg/(m^2).  GENERAL: vitals reviewed and listed above, alert, oriented, appears well hydrated and in no acute distress  HEENT: atraumatic, conjunttiva clear, no obvious abnormalities on inspection of external nose and ears  NECK: no obvious masses on inspection  LUNGS: clear to auscultation bilaterally, no wheezes, rales or rhonchi, good air movement  CV: HRRR, no peripheral edema  MS: moves all extremities without noticeable abnormality -Normal inspection of both knees, no swelling or effusion appreciated, no breakdown of skin or redness. She  has some tenderness to palpation in the lateral hamstrings. No tenderness on palpation of the deep veins. No other tenderness to palpation. Negative Lachman, negative drawer, normal strength and range of motion in both knees. On inspection of the right elbow there is no swelling, redness or skin changes. She is tender in the extensor tendon in their attachment to the elbow. No other abnormalities found. Normal range of motion in the elbow. Normal strength in the upper extremities. Neurovascularly intact distally.  PSYCH: pleasant and cooperative, no obvious depression or anxiety  ASSESSMENT AND PLAN:  Discussed the following assessment and plan:  Pain of lower extremity, unspecified laterality -Suspect hamstring strain -Perhaps from changed flatter shoes -Home exercise program -Follow up in 4 weeks  Tennis elbow, right -Home exercise program, ice,  anti-inflammatory medication and follow-up in 3-4 weeks  -Patient advised to return or notify a doctor immediately if symptoms worsen or persist or new concerns arise.  Patient Instructions  Before you leave: -Hamstring exercises -Tennis elbow exercises -Schedule follow-up in 4 weeks  Take the naproxen as instructed  Ice or heat to the elbow twice daily for 15 minutes  Do the exercises at least 4 days per week     Alandis Bluemel R.

## 2015-05-06 ENCOUNTER — Telehealth: Payer: Self-pay | Admitting: *Deleted

## 2015-05-06 NOTE — Telephone Encounter (Signed)
Spoke with pt, Aware of dr Jacalyn Lefevre recommendations.  This note will be faxed to [facility]

## 2015-05-06 NOTE — Telephone Encounter (Signed)
Pt called to report having some dental surgery and is having conscious sedation and the dentist needs clearance for that procedure. They are also asking regarding her metoprolol, if she should take it or not take it the day of the procedure. Will forward for dr Stanford Breed review

## 2015-05-06 NOTE — Telephone Encounter (Signed)
[place] for dental surgery; take metoprolol Kirk Ruths

## 2015-05-25 ENCOUNTER — Ambulatory Visit (INDEPENDENT_AMBULATORY_CARE_PROVIDER_SITE_OTHER): Payer: BC Managed Care – PPO | Admitting: Cardiology

## 2015-05-25 ENCOUNTER — Encounter: Payer: Self-pay | Admitting: Cardiology

## 2015-05-25 VITALS — BP 126/75 | HR 72 | Ht 65.25 in | Wt 124.0 lb

## 2015-05-25 DIAGNOSIS — R002 Palpitations: Secondary | ICD-10-CM

## 2015-05-25 NOTE — Assessment & Plan Note (Signed)
Symptoms are reasonably well controlled. Continue present dose of Toprol. I have asked her to take an additional 25 mg as needed for worsening palpitations.

## 2015-05-25 NOTE — Progress Notes (Signed)
      HPI: FU palpitations. Echocardiogram in February of 2014 showed normal LV function and mild mitral regurgitation. Event monitor showed sinus rhythm with PVCs. Echocardiogram repeated January 2016 and showed normal LV function, mild mitral valve prolapse and mild mitral regurgitation. Toprol added. Since I last saw her, She occasionally has brief flutters but no sustained palpitations. No dyspnea, chest pain or syncope.  Current Outpatient Prescriptions  Medication Sig Dispense Refill  . ibuprofen (ADVIL,MOTRIN) 200 MG tablet Take 200 mg by mouth every 6 (six) hours as needed for moderate pain.    . metoprolol succinate (TOPROL-XL) 25 MG 24 hr tablet TAKE ONE TABLET BY MOUTH ONE TIME DAILY 90 tablet 1   No current facility-administered medications for this visit.     Past Medical History  Diagnosis Date  . Chicken pox   . Headache(784.0)     frequent  . Murmur   . Mitral valve prolapse   . Scoliosis   . Infertility, female   . IBS (irritable bowel syndrome)     History reviewed. No pertinent past surgical history.  Social History   Social History  . Marital Status: Married    Spouse Name: N/A  . Number of Children: 2  . Years of Education: N/A   Occupational History  .      First grade teacher   Social History Main Topics  . Smoking status: Never Smoker   . Smokeless tobacco: Not on file  . Alcohol Use: No  . Drug Use: No  . Sexual Activity: Yes   Other Topics Concern  . Not on file   Social History Narrative          Work or School: [employer]              Home Situation: has two adopted children             Spiritual Beliefs: methodist              Lifestyle: no regular exercise, heathy diet                Family History  Problem Relation Age of Onset  . Colon cancer Paternal Grandfather   . Lung cancer Maternal Grandfather   . Asthma Mother     ROS: no fevers or chills, productive cough, hemoptysis, dysphasia, odynophagia,  melena, hematochezia, dysuria, hematuria, rash, seizure activity, orthopnea, PND, pedal edema, claudication. Remaining systems are negative.  Physical Exam: Well-developed well-nourished in no acute distress.  Skin is warm and dry.  HEENT is normal.  Neck is supple.  Chest is clear to auscultation with normal expansion.  Cardiovascular exam is regular rate and rhythm.  Abdominal exam nontender or distended. No masses palpated. Extremities show no edema. neuro grossly intact  ECG Sinus rhythm at a rate of 67. Nonspecific T-wave changes.

## 2015-05-25 NOTE — Patient Instructions (Signed)
Your physician wants you to follow-up in: ONE YEAR WITH DR CRENSHAW You will receive a reminder letter in the mail two months in advance. If you don't receive a letter, please call our office to schedule the follow-up appointment.   If you need a refill on your cardiac medications before your next appointment, please call your pharmacy.  

## 2015-05-26 ENCOUNTER — Ambulatory Visit (INDEPENDENT_AMBULATORY_CARE_PROVIDER_SITE_OTHER): Payer: BC Managed Care – PPO | Admitting: Family Medicine

## 2015-05-26 DIAGNOSIS — R69 Illness, unspecified: Secondary | ICD-10-CM

## 2015-05-26 NOTE — Progress Notes (Signed)
NO SHOW

## 2015-08-03 ENCOUNTER — Other Ambulatory Visit: Payer: Self-pay | Admitting: Cardiology

## 2016-01-11 ENCOUNTER — Other Ambulatory Visit: Payer: Self-pay | Admitting: Cardiology

## 2016-04-12 ENCOUNTER — Other Ambulatory Visit: Payer: Self-pay | Admitting: Cardiology

## 2016-07-11 ENCOUNTER — Other Ambulatory Visit: Payer: Self-pay | Admitting: Cardiology

## 2016-07-11 NOTE — Telephone Encounter (Signed)
Rx(s) sent to pharmacy electronically.  

## 2016-08-27 ENCOUNTER — Ambulatory Visit (INDEPENDENT_AMBULATORY_CARE_PROVIDER_SITE_OTHER): Payer: BC Managed Care – PPO | Admitting: Family Medicine

## 2016-08-27 ENCOUNTER — Encounter: Payer: Self-pay | Admitting: Family Medicine

## 2016-08-27 VITALS — BP 118/80 | HR 72 | Temp 98.2°F | Ht 65.25 in | Wt 130.5 lb

## 2016-08-27 DIAGNOSIS — G43109 Migraine with aura, not intractable, without status migrainosus: Secondary | ICD-10-CM | POA: Diagnosis not present

## 2016-08-27 MED ORDER — SUMATRIPTAN SUCCINATE 50 MG PO TABS: ORAL_TABLET | ORAL | 0 refills | Status: DC

## 2016-08-27 NOTE — Patient Instructions (Signed)
BEFORE YOU LEAVE: -follow up: 1 month -migraine trigger/journal  Keep migraine journal on list of triggers.  Consider increasing the metoprolol.  Can try the imitrex to see if this helps. Also can try tylenol and/or tiger balm.  Consider osteopathic treatment. Please contact your insurance company regarding coverage. If you wish to do this please schedule a 30 minute appointment at the beginning or end of my clinic.  I hope you are feeling better soon! Seek care sooner if worsening, new concerns or you are not improving with treatment.

## 2016-08-27 NOTE — Progress Notes (Signed)
HPI:  Acute visit for Migraines: -chronic, since highschool -does get aura sometimes, usually R sided -has had 3 headaches in last few weeks which is more then she usually will get in 1 month -uses aleve -no sig change o/w -no symptoms today -does admit had poor sleep and period before 2 of these migraines which are  Triggers -takes metoprolol for palpitations  ROS: See pertinent positives and negatives per HPI.  Past Medical History:  Diagnosis Date  . Chicken pox   . Headache(784.0)    frequent  . IBS (irritable bowel syndrome)   . Infertility, female   . Mitral valve prolapse   . Murmur   . Scoliosis     No past surgical history on file.  Family History  Problem Relation Age of Onset  . Asthma Mother   . Colon cancer Paternal Grandfather   . Lung cancer Maternal Grandfather     Social History   Social History  . Marital status: Married    Spouse name: N/A  . Number of children: 2  . Years of education: N/A   Occupational History  .      First grade teacher   Social History Main Topics  . Smoking status: Never Smoker  . Smokeless tobacco: Never Used  . Alcohol use No  . Drug use: No  . Sexual activity: Yes   Other Topics Concern  . None   Social History Narrative          Work or School: [employer] Situation: has two adopted children             Spiritual Beliefs: methodist              Lifestyle: no regular exercise, heathy diet                 Current Outpatient Prescriptions:  .  ibuprofen (ADVIL,MOTRIN) 200 MG tablet, Take 200 mg by mouth every 6 (six) hours as needed for moderate pain., Disp: , Rfl:  .  metoprolol succinate (TOPROL-XL) 25 MG 24 hr tablet, Take 1 tablet (25 mg total) by mouth daily., Disp: 90 tablet, Rfl: 0 .  SUMAtriptan (IMITREX) 50 MG tablet, Take 1 tablet at the first sign of a migraine. May repeat once in 2 hours if needed., Disp: 10 tablet, Rfl: 0  EXAM:  Vitals:   08/27/16 1512   BP: 118/80  Pulse: 72  Temp: 98.2 F (36.8 C)    Body mass index is 21.55 kg/m.  GENERAL: vitals reviewed and listed above, alert, oriented, appears well hydrated and in no acute distress  HEENT: atraumatic, conjunttiva clear, no obvious abnormalities on inspection of external nose and ears  NECK: no obvious masses on inspection  LUNGS: clear to auscultation bilaterally, no wheezes, rales or rhonchi, good air movement  CV: HRRR, no peripheral edema  MS: moves all extremities without noticeable abnormality  PSYCH: pleasant and cooperative, no obvious depression or anxiety, CN II-XII grossly intact.  ASSESSMENT AND PLAN:  Discussed the following assessment and plan:  Migraine with aura and without status migrainosus, not intractable  -discussed tx options/risks -opted for migraine journal, trial imitrex, consider increasing bb - she wants to discuss with her cardiologist first - sees him later this week -follow up 1 month - may need labs and imaging if persistent increased frequency though triggers could be sleep, barometric pressure changes, perimenopause -Patient advised to  return or notify a doctor immediately if symptoms worsen or persist or new concerns arise.  Patient Instructions  BEFORE YOU LEAVE: -follow up: 1 month -migraine trigger/journal  Keep migraine journal on list of triggers.  Consider increasing the metoprolol.  Can try the imitrex to see if this helps. Also can try tylenol and/or tiger balm.  Consider osteopathic treatment. Please contact your insurance company regarding coverage. If you wish to do this please schedule a 30 minute appointment at the beginning or end of my clinic.  I hope you are feeling better soon! Seek care sooner if worsening, new concerns or you are not improving with treatment.        Colin Benton R., DO

## 2016-08-28 NOTE — Progress Notes (Signed)
      HPI: FU palpitations. Echocardiogram in February of 2014 showed normal LV function and mild mitral regurgitation. Event monitor showed sinus rhythm with PVCs. Echocardiogram repeated January 2016 and showed normal LV function, mild mitral valve prolapse and mild mitral regurgitation. Toprol added. Since I last saw her, she continues to have occasional palpitations described as heart skipping. These are not sustained. She otherwise denies dyspnea, chest pain, syncope.  Current Outpatient Prescriptions  Medication Sig Dispense Refill  . ibuprofen (ADVIL,MOTRIN) 200 MG tablet Take 200 mg by mouth every 6 (six) hours as needed for moderate pain.    . metoprolol succinate (TOPROL-XL) 25 MG 24 hr tablet Take 1 tablet (25 mg total) by mouth daily. 90 tablet 0  . SUMAtriptan (IMITREX) 50 MG tablet Take 1 tablet at the first sign of a migraine. May repeat once in 2 hours if needed. 10 tablet 0   No current facility-administered medications for this visit.      Past Medical History:  Diagnosis Date  . Chicken pox   . Headache(784.0)    frequent  . IBS (irritable bowel syndrome)   . Infertility, female   . Mitral valve prolapse   . Murmur   . Scoliosis     History reviewed. No pertinent surgical history.  Social History   Social History  . Marital status: Married    Spouse name: N/A  . Number of children: 2  . Years of education: N/A   Occupational History  .      First grade teacher   Social History Main Topics  . Smoking status: Never Smoker  . Smokeless tobacco: Never Used  . Alcohol use No  . Drug use: No  . Sexual activity: Yes   Other Topics Concern  . Not on file   Social History Narrative          Work or School: [employer]              Home Situation: has two adopted children             Spiritual Beliefs: methodist              Lifestyle: no regular exercise, heathy diet                Family History  Problem Relation Age of Onset  .  Asthma Mother   . Colon cancer Paternal Grandfather   . Lung cancer Maternal Grandfather     ROS: no fevers or chills, productive cough, hemoptysis, dysphasia, odynophagia, melena, hematochezia, dysuria, hematuria, rash, seizure activity, orthopnea, PND, pedal edema, claudication. Remaining systems are negative.  Physical Exam: Well-developed well-nourished in no acute distress.  Skin is warm and dry.  HEENT is normal.  Neck is supple.  Chest is clear to auscultation with normal expansion.  Cardiovascular exam is regular rate and rhythm.  Abdominal exam nontender or distended. No masses palpated. Extremities show no edema. neuro grossly intact  ECG- sinus rhythm at a rate of 74. Anterior T-wave inversion. Short PR interval. Unchanged compared to 05/25/2015. personally reviewed  A/P  1 palpitations-patient's symptoms have improved with Toprol but she continues to have occasional "skips". Previous monitor showed PVCs. I will increase Toprol to 50 mg daily to see if her symptoms improve.  2 irritable bowel syndrome-management per primary care.  3 migraine headaches-we are increasing beta-blockade which may also help with migraines.  Kirk Ruths, MD

## 2016-08-30 ENCOUNTER — Ambulatory Visit (INDEPENDENT_AMBULATORY_CARE_PROVIDER_SITE_OTHER): Payer: BC Managed Care – PPO | Admitting: Cardiology

## 2016-08-30 ENCOUNTER — Encounter: Payer: Self-pay | Admitting: Cardiology

## 2016-08-30 VITALS — BP 112/70 | HR 74 | Ht 65.25 in | Wt 130.0 lb

## 2016-08-30 DIAGNOSIS — R002 Palpitations: Secondary | ICD-10-CM | POA: Diagnosis not present

## 2016-08-30 MED ORDER — METOPROLOL SUCCINATE ER 50 MG PO TB24: 50.0000 mg | ORAL_TABLET | Freq: Every day | ORAL | 3 refills | Status: DC

## 2016-08-30 NOTE — Patient Instructions (Signed)
Medication Instructions:   INCREASE METOPROLOL TO 50 MG ONCE DAILY= 2 OF THE 25 MG TABLETS ONCE DAILY  Follow-Up:  Your physician wants you to follow-up in: ONE YEAR WITH DR CRENSHAW You will receive a reminder letter in the mail two months in advance. If you don't receive a letter, please call our office to schedule the follow-up appointment.   If you need a refill on your cardiac medications before your next appointment, please call your pharmacy.    

## 2016-09-28 ENCOUNTER — Ambulatory Visit (INDEPENDENT_AMBULATORY_CARE_PROVIDER_SITE_OTHER): Payer: BC Managed Care – PPO | Admitting: Family Medicine

## 2016-09-28 ENCOUNTER — Encounter: Payer: Self-pay | Admitting: Family Medicine

## 2016-09-28 VITALS — BP 98/62 | HR 76 | Temp 98.6°F | Ht 65.25 in | Wt 129.4 lb

## 2016-09-28 DIAGNOSIS — R002 Palpitations: Secondary | ICD-10-CM

## 2016-09-28 DIAGNOSIS — G43909 Migraine, unspecified, not intractable, without status migrainosus: Secondary | ICD-10-CM | POA: Insufficient documentation

## 2016-09-28 DIAGNOSIS — G43809 Other migraine, not intractable, without status migrainosus: Secondary | ICD-10-CM

## 2016-09-28 NOTE — Patient Instructions (Signed)
BEFORE YOU LEAVE: -follow up: 6 months  I am glad you are doing better.   I would recommend sticking to the lower dose of your Metoprolol for now.  Continue headache journal.  Imitrex if needed.  WE NOW OFFER   [facility] FAST TRACK!!!  SAME DAY Appointments for ACUTE CARE  Such as: Sprains, Injuries, cuts, abrasions, rashes, muscle pain, joint pain, back pain Colds, flu, sore throats, headache, allergies, cough, fever  Ear pain, sinus and eye infections Abdominal pain, nausea, vomiting, diarrhea, upset stomach Animal/insect bites  3 Easy Ways to Schedule: Walk-In Scheduling Call in scheduling Mychart Sign-up: [email or web address]

## 2016-09-28 NOTE — Progress Notes (Signed)
HPI:  Due for flu shot  Follow up migraines: -chronic - since child hood -meds: bb (also take for palpitations) -trial imitrex last visit -reports: doing much better, has only had a few headaches this month with definite triggers of onset of her menses and stressful day starting back to work with change in her sleep pattern; these were milder headaches and did not have atypical features so she has not tried the Imitrex -Cardiologist advised she could increase her beta blocker, she has not done so yet -denies:frequent or worsening headaches, palpitations on a regular basis, dizziness, neck stiffness, fevers or any other symptoms  ROS: See pertinent positives and negatives per HPI.  Past Medical History:  Diagnosis Date  . Chicken pox   . Headache(784.0)    frequent  . IBS (irritable bowel syndrome)   . Infertility, female   . Mitral valve prolapse   . Murmur   . Scoliosis     No past surgical history on file.  Family History  Problem Relation Age of Onset  . Asthma Mother   . Colon cancer Paternal Grandfather   . Lung cancer Maternal Grandfather     Social History   Social History  . Marital status: Married    Spouse name: N/A  . Number of children: 2  . Years of education: N/A   Occupational History  .      First grade teacher   Social History Main Topics  . Smoking status: Never Smoker  . Smokeless tobacco: Never Used  . Alcohol use No  . Drug use: No  . Sexual activity: Yes   Other Topics Concern  . None   Social History Narrative          Work or School: [employer] Situation: has two adopted children             Spiritual Beliefs: methodist              Lifestyle: no regular exercise, heathy diet                 Current Outpatient Prescriptions:  .  ibuprofen (ADVIL,MOTRIN) 200 MG tablet, Take 200 mg by mouth every 6 (six) hours as needed for moderate pain., Disp: , Rfl:  .  metoprolol succinate (TOPROL-XL) 50  MG 24 hr tablet, Take 1 tablet (50 mg total) by mouth daily., Disp: 90 tablet, Rfl: 3 .  SUMAtriptan (IMITREX) 50 MG tablet, Take 1 tablet at the first sign of a migraine. May repeat once in 2 hours if needed., Disp: 10 tablet, Rfl: 0  EXAM:  Vitals:   09/28/16 1518  BP: 98/62  Pulse: 76  Temp: 98.6 F (37 C)    Body mass index is 21.37 kg/m.  GENERAL: vitals reviewed and listed above, alert, oriented, appears well hydrated and in no acute distress  HEENT: atraumatic, conjunttiva clear, no obvious abnormalities on inspection of external nose and ears  NECK: no obvious masses on inspection  LUNGS: clear to auscultation bilaterally, no wheezes, rales or rhonchi, good air movement  CV: HRRR, no peripheral edema  MS: moves all extremities without noticeable abnormality  PSYCH: pleasant and cooperative, no obvious depression or anxiety  ASSESSMENT AND PLAN:  Discussed the following assessment and plan:  Other migraine without status migrainosus, not intractable  Palpitations  -Given vital signs today, headaches have been mild and palpitations well-controlled, advised against increasing the  Beta blocker for now -she plans to continue the migraine journal -Imitrex if needed -She declined flu shot today as she plans to get with her children -follow-up 6 months -Patient advised to return or notify a doctor immediately if symptoms worsen or persist or new concerns arise.  Patient Instructions  BEFORE YOU LEAVE: -follow up: 6 months  I am glad you are doing better.   I would recommend sticking to the lower dose of your Metoprolol for now.  Continue headache journal.  Imitrex if needed.  WE NOW OFFER   [facility] FAST TRACK!!!  SAME DAY Appointments for ACUTE CARE  Such as: Sprains, Injuries, cuts, abrasions, rashes, muscle pain, joint pain, back pain Colds, flu, sore throats, headache, allergies, cough, fever  Ear pain, sinus and eye  infections Abdominal pain, nausea, vomiting, diarrhea, upset stomach Animal/insect bites  3 Easy Ways to Schedule: Walk-In Scheduling Call in scheduling Mychart Sign-up: [email or web address]            Colin Benton R., DO

## 2016-10-17 ENCOUNTER — Telehealth: Payer: Self-pay | Admitting: *Deleted

## 2016-10-17 DIAGNOSIS — R002 Palpitations: Secondary | ICD-10-CM

## 2016-10-17 MED ORDER — METOPROLOL SUCCINATE ER 25 MG PO TB24: 25.0000 mg | ORAL_TABLET | Freq: Every day | ORAL | 11 refills | Status: DC

## 2016-10-17 NOTE — Telephone Encounter (Signed)
Patient left a msg on the refill vm requesting an rx for her previous dose of metoprolol. She stated that her dose was increased to help with her headaches but it has not really helped and she feels more fatigued on this dose. She can be reached at [phone].

## 2016-10-17 NOTE — Telephone Encounter (Signed)
Okay to follow PCP order but may not see improvement on arrhythmia.   Continue metoprolol 25mg  as intrusted by PCP and call back if additional cardiac symptoms noted

## 2016-10-17 NOTE — Telephone Encounter (Signed)
S/w pt she states that at her last appt(08-27-16) with Dr Stanford Breed she was instructed to increase metoprolol to 50mg  to help with her headaches and "occasional "skips", she states that she did this for a few days until she had an appt with her pcp 09-28-16 she told her that it was ok to decrease metoprolol to 25mg , pt states that she was feeling fatigued her PCP said this was ok and this is her "old dose"  She states that she needs a refill of the 25mg . Pt notified that Dr Stanford Breed is out of the office until 10-22-16 she states that she has enough to last until about then.

## 2016-10-17 NOTE — Telephone Encounter (Signed)
Pt notified she will call back if the arrhythmia comes back. Refill sent to [facility] as requested

## 2016-10-18 ENCOUNTER — Encounter: Payer: Self-pay | Admitting: Family Medicine

## 2017-02-07 ENCOUNTER — Encounter: Payer: Self-pay | Admitting: Family Medicine

## 2017-07-19 ENCOUNTER — Telehealth: Payer: Self-pay | Admitting: Cardiology

## 2017-07-19 DIAGNOSIS — R002 Palpitations: Secondary | ICD-10-CM

## 2017-07-19 NOTE — Telephone Encounter (Signed)
Returned call to patient with MD recommendations. She agreed w/plan. Med list updated - she does not need refill right now. Will staff message [facility] scheduling pool to arrange PAOV

## 2017-07-19 NOTE — Telephone Encounter (Signed)
Returned call to patient of Dr. Stanford Breed. She reports her PVCs have been occurring more often over the past 2 days. She states the palpitations are making her anxious which in turn makes her palpitations worse. 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 She still takes metoprolol succinate 25mg  daily. She does not monitor BP/HR at home.   Advised patient would notify MD - may need APP f/up as MD is not available until Sept or does she need any testing?

## 2017-07-19 NOTE — Telephone Encounter (Signed)
Take additional 25 mg toprol daily  As needed; fu paov Kirk Ruths

## 2017-07-19 NOTE — Telephone Encounter (Signed)
New Message   Pt callings, states she is having some issues with her PVC and wants to speak with a nurse. Please call

## 2017-07-28 NOTE — Progress Notes (Signed)
Cardiology Office Note   Date:  07/29/2017   ID:  Debra Johnson, DOB 08-19-1970, MRN [id number]  PCP:  Lucretia Kern, DO  Cardiologist: Dr Stanford Breed Chief Complaint  Patient presents with  . Palpitations     History of Present Illness: Debra Johnson is a 47 y.o. female who presents for ongoing assessment and management of palpitations, most recent echocardiogram in January 2016 revealed normal LV function, mild mitral valve prolapse and mild mitral regurg.  The patient was placed on metoprolol by Dr. Stanford Breed.  She was last seen 08/30/2016 and was feeling some better but continued to have occasional palpitations.  On that office visit, metoprolol was increased to 50 mg daily.  The patient called our office on 07/19/2017 with more complaints of palpitations.  She was advised to take an additional 25 mg as needed and is here for follow-up for further evaluation.  The patient states that she is continuing to feel palpitations.  Usually was position change, or increase activity such as going up and down her stairs.  She denies caffeine use, medications with pseudoephedrine, thyroid disease, excessive stress or anxiety.  She did not take the extra dose of metoprolol on the day she called as her palpitations settled out on there own.  The patient has a heart monitor that she can place her fingers on if she has feelings of palpitations.  She printed out and brought with her.  It appears that she is having PACs based upon the printout  she has brought to [place].  Past Medical History:  Diagnosis Date  . Chicken pox   . Headache(784.0)    frequent  . IBS (irritable bowel syndrome)   . Infertility, female   . Mitral valve prolapse   . Murmur   . Scoliosis     History reviewed. No pertinent surgical history.   Current Outpatient Medications  Medication Sig Dispense Refill  . ibuprofen (ADVIL,MOTRIN) 200 MG tablet Take 200 mg by mouth every 6 (six) hours as needed for moderate pain.    . metoprolol  succinate (TOPROL-XL) 25 MG 24 hr tablet Take 1 tablet (25 mg total) by mouth daily. 30 tablet 11  . SUMAtriptan (IMITREX) 50 MG tablet Take 1 tablet at the first sign of a migraine. May repeat once in 2 hours if needed. 10 tablet 0   No current facility-administered medications for this visit.     Allergies:   Sulfa antibiotics    Social History:  The patient  reports that she has never smoked. She has never used smokeless tobacco. She reports that she does not drink alcohol or use drugs.   Family History:  The patient's family history includes Asthma in her mother; Colon cancer in her paternal grandfather; Lung cancer in her maternal grandfather.    ROS: All other systems are reviewed and negative. Unless otherwise mentioned in H&P    PHYSICAL EXAM: VS:  BP 115/74   Pulse 67   Ht 5\' 4"  (1.626 m)   Wt 129 lb 9.6 oz (58.8 kg)   BMI 22.25 kg/m  , BMI Body mass index is 22.25 kg/m. GEN: Well nourished, well developed, in no acute distress  HEENT: normal  Neck: no JVD, carotid bruits, or masses Cardiac: RRR; soft  murmurs, rubs, or gallops,no edema  Respiratory:  clear to auscultation bilaterally, normal work of breathing GI: soft, nontender, nondistended, + BS MS: no deformity or atrophy  Skin: warm and dry, no rash Neuro:  Strength and sensation  are intact Psych: euthymic mood, full affect   EKG: Sinus rhythm, possible left atrial enlargement, nonspecific T wave abnormality was noted in V1 and V2.  Recent Labs: No results found for requested labs within last 8760 hours.    Lipid Panel    Component Value Date/Time   CHOL 179 11/20/2011 1512   TRIG 61.0 11/20/2011 1512   HDL 53.40 11/20/2011 1512   CHOLHDL 3 11/20/2011 1512   VLDL 12.2 11/20/2011 1512   LDLCALC 113 (H) 11/20/2011 1512      Wt Readings from Last 3 Encounters:  07/29/17 129 lb 9.6 oz (58.8 kg)  09/28/16 129 lb 6.4 oz (58.7 kg)  08/30/16 130 lb (59 kg)      Other studies  Reviewed: Echocardiogram 02/23/2014  Left ventricle: Systolic function was normal. The estimated ejection fraction was in the range of 60% to 65%. Left ventricular diastolic function parameters were normal. - Mitral valve: Mild prolapse, involving the anterior leaflet. There was mild regurgitation.  ASSESSMENT AND PLAN:  1.  Recurrent palpitations: She has a home monitor that she thought that she can place her fingertips on when she is having palpitations.  She did give me a copy of the printout.  It appears that she is having PACs.  For accuracy, I am going to go ahead and put a 2-week cardiac monitor on her for evaluation of frequency and morphology of palpitations and possible PACs based upon her printout.  I am going to check magnesium, TSH, and BMET to evaluate further concerning electrolyte abnormalities causing symptoms. Continue metoprolol at current dose.   2.  Mild mitral valve prolapse: Echocardiogram was completed in January 2016 which demonstrated mild mitral prolapse of the anterior leaflet with mild regurg.  I am going to repeat her echocardiogram to evaluate the mitral valve again to ascertain if any changes have occurred which may be contributing to her palpitations.   Current medicines are reviewed at length with the patient today.    Labs/ tests ordered today include: BMET, TSH, Mg+   Phill Myron. West Pugh, ANP, AACC   07/29/2017 11:13 AM    [facility]. [address] [place], [place] [address] Phone: [phone]; Fax: [phone]

## 2017-07-29 ENCOUNTER — Ambulatory Visit: Payer: BC Managed Care – PPO | Admitting: Adult Health

## 2017-07-29 ENCOUNTER — Encounter: Payer: Self-pay | Admitting: Adult Health

## 2017-07-29 VITALS — BP 115/74 | HR 67 | Ht 64.0 in | Wt 129.6 lb

## 2017-07-29 DIAGNOSIS — I341 Nonrheumatic mitral (valve) prolapse: Secondary | ICD-10-CM

## 2017-07-29 DIAGNOSIS — R002 Palpitations: Secondary | ICD-10-CM

## 2017-07-29 DIAGNOSIS — Z79899 Other long term (current) drug therapy: Secondary | ICD-10-CM | POA: Diagnosis not present

## 2017-07-29 NOTE — Patient Instructions (Addendum)
Medication Instructions:  NO CHANGES- Your physician recommends that you continue on your current medications as directed. Please refer to the Current Medication list given to you today.  If you need a refill on your cardiac medications before your next appointment, please call your pharmacy.  Labwork: BMET, MAG AND TSH TODAY HERE IN OUR OFFICE AT [facility]  Take the provided lab slips with you to the lab for your blood draw.   Testing/Procedures: Echocardiogram - Your physician has requested that you have an echocardiogram. Echocardiography is a painless test that uses sound waves to create images of your heart. It provides your doctor with information about the size and shape of your heart and how well your heart's chambers and valves are working. This procedure takes approximately one hour. There are no restrictions for this procedure. This will be performed at our [facility] location - [address], [address].  Your physician has recommended that you wear an event monitor-14 day. Event monitors are medical devices that record the heart's electrical activity. Doctors most often [place] these monitors to diagnose arrhythmias. Arrhythmias are problems with the speed or rhythm of the heartbeat. The monitor is a small, portable device. You can wear one while you do your normal daily activities. This is usually used to diagnose what is causing palpitations/syncope (passing out). This will be performed at our [facility] location - [address].  Special Instructions: SEE ATTACH RE:PAC's  Follow-Up: Your physician wants you to follow-up in: 5-6 [place] ([place]), DNP,AACC IF PRIMARY CARDIOLOGIST IS UNAVAILABLE.    Thank you for choosing [facility] at [facility] ([place])!!      Premature Atrial Contraction A premature atrial contraction [facility]) is a kind of irregular heartbeat (arrhythmia). It happens when the heart beats too early  and then pauses before beating again. PACs are also called skipped heartbeats because they may make you feel like your heart is stopping for a second, even though the heart does not actually skip a beat. The heart has four areas, or chambers. Normally, electrical signals spread across the heart and make all the chambers beat together. During a PAC, the upper chambers of the heart (right atrium and left atrium) beat too early, before they have had time to fill with blood. The heartbeat pauses afterward so the heart can fill with blood for the next beat. What are the causes? The cause of this condition is often unknown. Sometimes it is caused by heart disease or injury to the heart. What increases the risk? This condition is more likely to develop in adults who are 2 years of age or older and in children. Episodes may be triggered by:  Caffeine.  Stress.  Tiredness.  Alcohol.  Smoking.  Stimulant drugs.  Heart disease.  What are the signs or symptoms? Symptoms of this condition include:  A feeling that your heart skipped a beat. The first heartbeat after the "skipped" beat may feel more forceful.  A feeling that your heart is fluttering.  How is this diagnosed? This condition is diagnosed based on:  Your symptoms.  A physical exam. Your health care provider may listen to your heart.  Tests to rule out other conditions, such as a test that records the electrical impulses of the heart and assesses heart health (electrocardiogram, or ECG). If you have an ECG, you may need to wear a portable ECG machine (Holter monitor) that records your heart beats for  24 hours or more.  How is this treated?  Usually, treatment is not needed for this condition. If you have episodes that happen often or if a cause is found, you may receive treatment for the underlying cause of your PACs. Follow these instructions at home: Lifestyle Follow these instructions as told by your health care  provider:  Do not use any products that contain nicotine or tobacco, such as cigarettes and e-cigarettes. If you need help quitting, ask your health care provider.  If caffeine triggers episodes, do not eat, drink, or use anything with caffeine in it.  If caffeine does not seem to trigger episodes, consume caffeine in moderation.  If alcohol triggers episodes of PAC, do not drink alcohol.  If alcohol does not seem to trigger episodes, limit alcohol intake to no more than 1 drink a day for nonpregnant women and 2 drinks a day for men. One drink equals 12 oz of beer, 5 oz of wine, or 1 oz of hard liquor.  Exercise regularly. Ask your health care provider what type of exercise is safe for you.  Find healthy ways to manage stress. Avoid stressful situations when possible.  Try to get at least 7-9 hours of sleep each night, or as much as recommended by your health care provider.  Do not use illegal drugs.  General instructions  Take over-the-counter and prescription medicines only as told by your health care provider.  Keep all follow-up visits as told by your health care provider. This is important. Contact a health care provider if:  You feel your heart skipping beats more than once a day.  Your heart skips beats and you feel dizzy, light-headed, or very tired. Get help right away if:  You have chest pain.  You have trouble breathing. This information is not intended to replace advice given to you by your health care provider. Make sure you discuss any questions you have with your health care provider. Document Released: 09/18/2013 Document Revised: 09/13/2015 Document Reviewed: 07/15/2015 Elsevier Interactive Patient Education  [employer].

## 2017-07-30 LAB — BASIC METABOLIC PANEL
BUN/Creatinine Ratio: 8 — ABNORMAL LOW (ref 9–23)
BUN: 6 mg/dL (ref 6–24)
CHLORIDE: 102 mmol/L (ref 96–106)
CO2: 25 mmol/L (ref 20–29)
Calcium: 9.7 mg/dL (ref 8.7–10.2)
Creatinine, Ser: 0.8 mg/dL (ref 0.57–1.00)
GFR, EST AFRICAN AMERICAN: 101 mL/min/{1.73_m2} (ref >59)
GFR, EST NON AFRICAN AMERICAN: 87 mL/min/{1.73_m2} (ref >59)
Glucose: 109 mg/dL — ABNORMAL HIGH (ref 65–99)
POTASSIUM: 4.5 mmol/L (ref 3.5–5.2)
SODIUM: 141 mmol/L (ref 134–144)

## 2017-07-30 LAB — MAGNESIUM: Magnesium: 1.8 mg/dL (ref 1.6–2.3)

## 2017-07-30 LAB — TSH: TSH: 2.13 u[IU]/mL (ref 0.450–4.500)

## 2017-07-31 ENCOUNTER — Ambulatory Visit (HOSPITAL_COMMUNITY): Payer: BC Managed Care – PPO | Attending: Adult Health

## 2017-07-31 ENCOUNTER — Other Ambulatory Visit: Payer: Self-pay

## 2017-07-31 DIAGNOSIS — I340 Nonrheumatic mitral (valve) insufficiency: Secondary | ICD-10-CM | POA: Insufficient documentation

## 2017-07-31 DIAGNOSIS — R011 Cardiac murmur, unspecified: Secondary | ICD-10-CM | POA: Diagnosis not present

## 2017-07-31 DIAGNOSIS — M419 Scoliosis, unspecified: Secondary | ICD-10-CM | POA: Diagnosis not present

## 2017-07-31 DIAGNOSIS — R002 Palpitations: Secondary | ICD-10-CM | POA: Insufficient documentation

## 2017-07-31 DIAGNOSIS — I341 Nonrheumatic mitral (valve) prolapse: Secondary | ICD-10-CM

## 2017-08-07 ENCOUNTER — Ambulatory Visit (INDEPENDENT_AMBULATORY_CARE_PROVIDER_SITE_OTHER): Payer: BC Managed Care – PPO

## 2017-08-07 DIAGNOSIS — I341 Nonrheumatic mitral (valve) prolapse: Secondary | ICD-10-CM | POA: Diagnosis not present

## 2017-08-07 DIAGNOSIS — R002 Palpitations: Secondary | ICD-10-CM

## 2017-09-08 NOTE — Progress Notes (Signed)
Cardiology Office Note   Date:  09/09/2017   ID:  Debra Johnson, DOB 1970-10-09, MRN [id number]  PCP:  Lucretia Kern, DO  Cardiologist: Dr. Stanford Breed Chief Complaint  Patient presents with  . Follow-up     History of Present Illness: Debra Johnson is a 47 y.o. female who presents for ongoing assessment and management of palpitations, last seen in the office on 07/29/2017 with continued complaints of same.  The patient did have an echocardiogram in January 2016 which demonstrated normal LV function, mild mitral valve prolapse and mild mitral regurg.  Review of cardiac monitor revealed frequent PACs in the past.  I repeated a cardiac monitor on recent office visit to evaluate for frequency and morphology of palpitations.  She was to continue beta-blocker as directed.  Echocardiogram was ordered.  Echo completed on 07/31/2017 revealed normal LV systolic function of 65 to 70% with vigorous wall motion, but no wall motion abnormalities.  Her mitral valve revealed mildly thickened leaflets, systolic bowing without prolapse, with trace to mild regurgitation.  Normal left atrial size.  Cardiac monitor revealed essentially normal sinus rhythm, with rare PACs and PVCs dated 08/07/2017, read by Dr. Stanford Breed.  She comes today still having some irregular rhythm on occasion. She is very sensitive to any irregular heat beat or body sensation She has not had to take extra doses of metoprolol.   Past Medical History:  Diagnosis Date  . Chicken pox   . Headache(784.0)    frequent  . IBS (irritable bowel syndrome)   . Infertility, female   . Mitral valve prolapse   . Murmur   . Scoliosis     No past surgical history on file.   Current Outpatient Medications  Medication Sig Dispense Refill  . ibuprofen (ADVIL,MOTRIN) 200 MG tablet Take 200 mg by mouth every 6 (six) hours as needed for moderate pain.    . metoprolol succinate (TOPROL-XL) 25 MG 24 hr tablet Take 1 tablet (25 mg total) by mouth daily.  30 tablet 11  . SUMAtriptan (IMITREX) 50 MG tablet Take 1 tablet at the first sign of a migraine. May repeat once in 2 hours if needed. 10 tablet 0   No current facility-administered medications for this visit.     Allergies:   Sulfa antibiotics    Social History:  The patient  reports that she has never smoked. She has never used smokeless tobacco. She reports that she does not drink alcohol or use drugs.   Family History:  The patient's family history includes Asthma in her mother; Colon cancer in her paternal grandfather; Lung cancer in her maternal grandfather.    ROS: All other systems are reviewed and negative. Unless otherwise mentioned in H&P    PHYSICAL EXAM: VS:  BP 116/76   Pulse 77   Ht 5\' 4"  (1.626 m)   Wt 130 lb 3.2 oz (59.1 kg)   BMI 22.35 kg/m  , BMI Body mass index is 22.35 kg/m. GEN: Well nourished, well developed, in no acute distress  Cardiac: RRR; no murmurs, rubs, or gallops,no edema  Respiratory:  clear to auscultation bilaterally, normal work of breathing Psych: euthymic mood, full affect   EKG: Not completed today.   Recent Labs: 07/29/2017: BUN 6; Creatinine, Ser 0.80; Magnesium 1.8; Potassium 4.5; Sodium 141; TSH 2.130    Lipid Panel    Component Value Date/Time   CHOL 179 11/20/2011 1512   TRIG 61.0 11/20/2011 1512   HDL 53.40 11/20/2011 1512  CHOLHDL 3 11/20/2011 1512   VLDL 12.2 11/20/2011 1512   LDLCALC 113 (H) 11/20/2011 1512      Wt Readings from Last 3 Encounters:  09/09/17 130 lb 3.2 oz (59.1 kg)  07/29/17 129 lb 9.6 oz (58.8 kg)  09/28/16 129 lb 6.4 oz (58.7 kg)      Other studies Reviewed: Echocardiogram - Left ventricle: The cavity size was normal. Wall thickness was   normal. Systolic function was vigorous. The estimated ejection   fraction was in the range of 65% to 70%. Wall motion was normal;   there were no regional wall motion abnormalities. Left   ventricular diastolic function parameters were normal. - Mitral  valve: Mildly thickened leaflets . Systolic bowing without   prolapse. Trace to mild regurgitation. - Left atrium: The atrium was normal in size. - Inferior vena cava: The vessel was normal in size. The   respirophasic diameter changes were in the normal range (>= 50%),   consistent with normal central venous pressure.  Impressions:  - Essentially normal study. Late systolic bowing of the mitral   valve with trace to mild regurgitation is noted. Normal LA size.  ASSESSMENT AND PLAN:  1.Palpitaitons: I have reviewed her cardiac monitor and given her a copy of it. She is given reassurance that she is having rare PVC's and PAC's. She will continue current regimen of metoprolol and can take an extra 1/2 dose if necessary for rapid HR.   Echo did not reveal VSD or structural heart disease. Bowing of the mitral valve but no prolapse. She will see [place] annually to evaluate her response to medications or for new problems.    Current medicines are reviewed at length with the patient today.    Labs/ tests ordered today include: None    Phill Myron. West Pugh, ANP, AACC   09/09/2017 12:16 PM    [facility] [address], [place], [place] [address] Phone: [phone]; Fax: [phone]

## 2017-09-09 ENCOUNTER — Encounter: Payer: Self-pay | Admitting: Adult Health

## 2017-09-09 ENCOUNTER — Ambulatory Visit: Payer: BC Managed Care – PPO | Admitting: Adult Health

## 2017-09-09 VITALS — BP 116/76 | HR 77 | Ht 64.0 in | Wt 130.2 lb

## 2017-09-09 DIAGNOSIS — R002 Palpitations: Secondary | ICD-10-CM | POA: Diagnosis not present

## 2017-09-09 MED ORDER — METOPROLOL SUCCINATE ER 25 MG PO TB24: 25.0000 mg | ORAL_TABLET | Freq: Every day | ORAL | 11 refills | Status: DC

## 2017-09-09 NOTE — Patient Instructions (Signed)
Medication Instructions:  NO CHANGES- Your physician recommends that you continue on your current medications as directed. Please refer to the Current Medication list given to you today.  If you need a refill on your cardiac medications before your next appointment, please call your pharmacy.  Testing/Procedures: Echocardiogram - 1 year (before follow up appointment).  Your physician has requested that you have an echocardiogram. Echocardiography is a painless test that uses sound waves to create images of your heart. It provides your doctor with information about the size and shape of your heart and how well your heart's chambers and valves are working. This procedure takes approximately one hour. There are no restrictions for this procedure. This will be performed at our [facility] location - [address], [address].  Follow-Up: Your physician wants you to follow-up in: 12 months with DR CRENSHAW. You should receive a reminder letter in the mail two months in advance. If you do not receive a letter, please call our office JU N 2020 to schedule the AUG 2020 follow-up appointment.   Thank you for choosing [facility] at [facility]!!

## 2018-07-02 ENCOUNTER — Ambulatory Visit: Payer: BC Managed Care – PPO | Admitting: Family Medicine

## 2018-07-11 ENCOUNTER — Ambulatory Visit (INDEPENDENT_AMBULATORY_CARE_PROVIDER_SITE_OTHER): Payer: BC Managed Care – PPO | Admitting: Family Medicine

## 2018-07-11 ENCOUNTER — Other Ambulatory Visit: Payer: Self-pay

## 2018-07-11 ENCOUNTER — Encounter: Payer: Self-pay | Admitting: Family Medicine

## 2018-07-11 DIAGNOSIS — Z131 Encounter for screening for diabetes mellitus: Secondary | ICD-10-CM

## 2018-07-11 DIAGNOSIS — R002 Palpitations: Secondary | ICD-10-CM

## 2018-07-11 DIAGNOSIS — Z13220 Encounter for screening for lipoid disorders: Secondary | ICD-10-CM

## 2018-07-11 DIAGNOSIS — Z1239 Encounter for other screening for malignant neoplasm of breast: Secondary | ICD-10-CM

## 2018-07-11 DIAGNOSIS — G43809 Other migraine, not intractable, without status migrainosus: Secondary | ICD-10-CM | POA: Diagnosis not present

## 2018-07-11 NOTE — Progress Notes (Signed)
Virtual Visit via Video Note  I connected with Debra Johnson  on 07/11/18 at 11:30 AM EDT by a video enabled telemedicine application and verified that I am speaking with the correct person using two identifiers.  Location patient: home Location provider:work office Persons participating in the virtual visit: patient, provider  I discussed the limitations of evaluation and management by telemedicine and the availability of in person appointments. The patient expressed understanding and agreed to proceed.   Debra Johnson DOB: 10-16-1970 Encounter date: 07/11/2018  This is a 48 y.o. female who presents to establish care. Chief Complaint  Patient presents with  . Establish Care   Pap due September 2020 (last with [facility])  No recent lipid history in our system: has been checked since last check here for purpose of insurance. Was normal then (thinks 5 years ago)  History of present illness: No specific concerns today transitioning from [facility].   Migraine:imitrex prn; sometimes gets these once weekly, but other times monthly. Usually at start of summer with more sleep and change in cycle she will get more migraines.   Scoliosis: did wear back brace at night in childhood. After that was followed yearly. Hasn't followed with anyone since. Does sometimes have problems with back hurting occasionally. Sometimes has lower back pain, esp with prolonged standing.     palpitations: follows with cardiology:metoprolol with extra dose prn rapid HR. Doesn't completely make palpitations go away, but since normal bp is on lower end of normal so if dose increased then her bp will drop.   IBS: only occasionally bothered by this now. About 20 years ago really affected her, but then seemed to settle down and now just out of blue where she will have a few weeks of increased sensitivity. Knows when it happens she just lets things work themselves out.   Has tickling sensation that occurs in chest only occasionally.  Has happened for over ten years. Used to happen in fall or spring (thought allergy related). Seems to go away with deep breath. Not associated with chest pain, pressure, shortness of breath, wheezing. Doesn't think she has GERD but states that tickling sensation does sometimes tend to occur in relation to eating.  Then since then has noted it intermittently; tends more in certain seasons.    Past Medical History:  Diagnosis Date  . Chicken pox   . Headache(784.0)    frequent  . IBS (irritable bowel syndrome)   . Infertility, female   . Murmur   . Scoliosis    Past Surgical History:  Procedure Laterality Date  . WISDOM TOOTH EXTRACTION     Allergies  Allergen Reactions  . Sulfa Antibiotics Hives    Childhood allergy    Current Meds  Medication Sig  . ibuprofen (ADVIL,MOTRIN) 200 MG tablet Take 200 mg by mouth every 6 (six) hours as needed for moderate pain.  . metoprolol succinate (TOPROL-XL) 25 MG 24 hr tablet Take 1 tablet (25 mg total) by mouth daily.  . SUMAtriptan (IMITREX) 50 MG tablet Take 1 tablet at the first sign of a migraine. May repeat once in 2 hours if needed.   Social History   Tobacco Use  . Smoking status: Never Smoker  . Smokeless tobacco: Never Used  Substance Use Topics  . Alcohol use: No   Family History  Problem Relation Age of Onset  . Asthma Mother   . Healthy Father   . Colon cancer Paternal Grandfather   . Lung cancer Maternal Grandfather   .  Allergies Sister   . Asthma Sister   . Healthy Brother   . Dementia Paternal Grandmother      Review of Systems  Constitutional: Negative for chills, fatigue and fever.  Respiratory: Negative for cough, chest tightness, shortness of breath and wheezing.   Cardiovascular: Positive for palpitations. Negative for chest pain and leg swelling.    Objective:  LMP 07/09/2018       BP Readings from Last 3 Encounters:  09/09/17 116/76  07/29/17 115/74  09/28/16 98/62   Wt Readings from Last 3  Encounters:  09/09/17 130 lb 3.2 oz (59.1 kg)  07/29/17 129 lb 9.6 oz (58.8 kg)  09/28/16 129 lb 6.4 oz (58.7 kg)    EXAM:  GENERAL: alert, oriented, appears well and in no acute distress  HEENT: atraumatic, conjunctiva clear, no obvious abnormalities on inspection of external nose and ears  NECK: normal movements of the head and neck  LUNGS: on inspection no signs of respiratory distress, breathing rate appears normal, no obvious gross SOB, gasping or wheezing  CV: no obvious cyanosis  MS: moves all visible extremities without noticeable abnormality  PSYCH/NEURO: pleasant and cooperative, no obvious depression or anxiety, speech and thought processing grossly intact  SKIN: no obvious facial/neck abnormalities  Assessment/Plan  1. Screening for breast cancer - MM DIGITAL SCREENING BILATERAL; Future  2. Palpitations Follows with cardiology; still with intermittent palpitations. Feels reassured about these after last cardiology visit. Cont with metoprolol - CBC with Differential/Platelet; Future - TSH; Future  3. Other migraine without status migrainosus, not intractable Controlled. Better if she stays consistent with sleep/wake cycle.   4. Lipid screening - Lipid panel; Future  5. Screening for diabetes mellitus - Comprehensive metabolic panel; Future    Return bloodwork/physical when able and within calendar year, for physical exam.   I discussed the assessment and treatment plan with the patient. The patient was provided an opportunity to ask questions and all were answered. The patient agreed with the plan and demonstrated an understanding of the instructions.   The patient was advised to call back or seek an in-person evaluation if the symptoms worsen or if the condition fails to improve as anticipated.  I provided 30 minutes of non-face-to-face time during this encounter.   Micheline Rough, MD

## 2018-07-14 ENCOUNTER — Telehealth: Payer: Self-pay | Admitting: *Deleted

## 2018-07-14 NOTE — Telephone Encounter (Signed)
-----   Message from Caren Macadam, MD sent at 07/11/2018 12:07 PM EDT ----- Please schedule physical (in office) - can wait until fall/winter if desired. She is due for repeat pap in September so any time that month or after. Bloodwork is ordered. She can do this now, or complete with physical. Her choice.

## 2018-07-14 NOTE — Telephone Encounter (Signed)
I called the pt and scheduled both appts as below.

## 2018-08-29 ENCOUNTER — Other Ambulatory Visit: Payer: Self-pay

## 2018-08-29 ENCOUNTER — Ambulatory Visit
Admission: RE | Admit: 2018-08-29 | Discharge: 2018-08-29 | Disposition: A | Discharge status: Home / Self Care | Payer: BC Managed Care – PPO | Source: Ambulatory Visit | Attending: Family Medicine | Admitting: Family Medicine

## 2018-08-29 DIAGNOSIS — Z1239 Encounter for other screening for malignant neoplasm of breast: Secondary | ICD-10-CM

## 2018-09-02 ENCOUNTER — Other Ambulatory Visit: Payer: Self-pay | Admitting: Family Medicine

## 2018-09-02 DIAGNOSIS — R928 Other abnormal and inconclusive findings on diagnostic imaging of breast: Secondary | ICD-10-CM

## 2018-09-04 ENCOUNTER — Other Ambulatory Visit: Payer: Self-pay

## 2018-09-04 ENCOUNTER — Ambulatory Visit
Admission: RE | Admit: 2018-09-04 | Discharge: 2018-09-04 | Disposition: A | Discharge status: Home / Self Care | Payer: BC Managed Care – PPO | Source: Ambulatory Visit | Attending: Family Medicine | Admitting: Family Medicine

## 2018-09-04 ENCOUNTER — Other Ambulatory Visit: Payer: Self-pay | Admitting: Family Medicine

## 2018-09-04 DIAGNOSIS — N6320 Unspecified lump in the left breast, unspecified quadrant: Secondary | ICD-10-CM

## 2018-09-04 DIAGNOSIS — R928 Other abnormal and inconclusive findings on diagnostic imaging of breast: Secondary | ICD-10-CM

## 2018-09-23 ENCOUNTER — Other Ambulatory Visit (INDEPENDENT_AMBULATORY_CARE_PROVIDER_SITE_OTHER): Payer: BC Managed Care – PPO

## 2018-09-23 ENCOUNTER — Other Ambulatory Visit: Payer: Self-pay

## 2018-09-23 DIAGNOSIS — Z131 Encounter for screening for diabetes mellitus: Secondary | ICD-10-CM | POA: Diagnosis not present

## 2018-09-23 DIAGNOSIS — R002 Palpitations: Secondary | ICD-10-CM

## 2018-09-23 DIAGNOSIS — Z13220 Encounter for screening for lipoid disorders: Secondary | ICD-10-CM

## 2018-09-23 LAB — COMPREHENSIVE METABOLIC PANEL
ALT: 11 U/L (ref 0–35)
AST: 12 U/L (ref 0–37)
Albumin: 4.4 g/dL (ref 3.5–5.2)
Alkaline Phosphatase: 79 U/L (ref 39–117)
BUN: 12 mg/dL (ref 6–23)
CO2: 28 mEq/L (ref 19–32)
Calcium: 9.6 mg/dL (ref 8.4–10.5)
Chloride: 102 mEq/L (ref 96–112)
Creatinine, Ser: 0.96 mg/dL (ref 0.40–1.20)
GFR: 61.69 mL/min (ref >60.00)
Glucose, Bld: 110 mg/dL — ABNORMAL HIGH (ref 70–99)
Potassium: 3.9 mEq/L (ref 3.5–5.1)
Sodium: 139 mEq/L (ref 135–145)
Total Bilirubin: 0.6 mg/dL (ref 0.2–1.2)
Total Protein: 6.6 g/dL (ref 6.0–8.3)

## 2018-09-23 LAB — CBC WITH DIFFERENTIAL/PLATELET
Basophils Absolute: 0 10*3/uL (ref 0.0–0.1)
Basophils Relative: 0.9 % (ref 0.0–3.0)
Eosinophils Absolute: 0.3 10*3/uL (ref 0.0–0.7)
Eosinophils Relative: 6.3 % — ABNORMAL HIGH (ref 0.0–5.0)
HCT: 40.4 % (ref 36.0–46.0)
Hemoglobin: 13.5 g/dL (ref 12.0–15.0)
Lymphocytes Relative: 31.8 % (ref 12.0–46.0)
Lymphs Abs: 1.7 10*3/uL (ref 0.7–4.0)
MCHC: 33.4 g/dL (ref 30.0–36.0)
MCV: 89.1 fl (ref 78.0–100.0)
Monocytes Absolute: 0.4 10*3/uL (ref 0.1–1.0)
Monocytes Relative: 8.6 % (ref 3.0–12.0)
Neutro Abs: 2.7 10*3/uL (ref 1.4–7.7)
Neutrophils Relative %: 52.4 % (ref 43.0–77.0)
Platelets: 195 10*3/uL (ref 150.0–400.0)
RBC: 4.53 Mil/uL (ref 3.87–5.11)
RDW: 13.3 % (ref 11.5–15.5)
WBC: 5.2 10*3/uL (ref 4.0–10.5)

## 2018-09-23 LAB — LIPID PANEL
Cholesterol: 218 mg/dL — ABNORMAL HIGH (ref 0–200)
HDL: 54.5 mg/dL (ref >39.00)
LDL Cholesterol: 147 mg/dL — ABNORMAL HIGH (ref 0–99)
NonHDL: 163.29
Total CHOL/HDL Ratio: 4
Triglycerides: 83 mg/dL (ref 0.0–149.0)
VLDL: 16.6 mg/dL (ref 0.0–40.0)

## 2018-09-23 LAB — TSH: TSH: 4.69 u[IU]/mL — ABNORMAL HIGH (ref 0.35–4.50)

## 2018-09-29 ENCOUNTER — Other Ambulatory Visit (INDEPENDENT_AMBULATORY_CARE_PROVIDER_SITE_OTHER): Payer: BC Managed Care – PPO

## 2018-09-29 DIAGNOSIS — R739 Hyperglycemia, unspecified: Secondary | ICD-10-CM

## 2018-09-29 LAB — HEMOGLOBIN A1C: Hgb A1c MFr Bld: 5.7 % (ref 4.6–6.5)

## 2018-09-30 NOTE — Progress Notes (Signed)
HPI: FU palpitations. Echocardiogram July 2019 showed normal LV function, mild mitral regurgitation.  Monitor July 2019 showed sinus rhythm with rare PAC and PVC.  Since I last saw her,  there is no dyspnea, chest pain or syncope.  She still has occasional palpitations but not particularly bothersome by her report.  Current Outpatient Medications  Medication Sig Dispense Refill  . ibuprofen (ADVIL,MOTRIN) 200 MG tablet Take 200 mg by mouth every 6 (six) hours as needed for moderate pain.    . metoprolol succinate (TOPROL-XL) 25 MG 24 hr tablet Take 1 tablet (25 mg total) by mouth daily. 30 tablet 11   No current facility-administered medications for this visit.      Past Medical History:  Diagnosis Date  . Chicken pox   . Headache(784.0)    frequent  . IBS (irritable bowel syndrome)   . Infertility, female   . Murmur   . Scoliosis     Past Surgical History:  Procedure Laterality Date  . WISDOM TOOTH EXTRACTION      Social History   Socioeconomic History  . Marital status: Married    Spouse name: Not on file  . Number of children: 2  . Years of education: Not on file  . Highest education level: Not on file  Occupational History    Comment: First grade teacher  Social Needs  . Financial resource strain: Not on file  . Food insecurity    Worry: Not on file    Inability: Not on file  . Transportation needs    Medical: Not on file    Non-medical: Not on file  Tobacco Use  . Smoking status: Never Smoker  . Smokeless tobacco: Never Used  Substance and Sexual Activity  . Alcohol use: No  . Drug use: No  . Sexual activity: Yes  Lifestyle  . Physical activity    Days per week: Not on file    Minutes per session: Not on file  . Stress: Not on file  Relationships  . Social Herbalist on phone: Not on file    Gets together: Not on file    Attends religious service: Not on file    Active member of club or organization: Not on file    Attends  meetings of clubs or organizations: Not on file    Relationship status: Not on file  . Intimate partner violence    Fear of current or ex partner: Not on file    Emotionally abused: Not on file    Physically abused: Not on file    Forced sexual activity: Not on file  Other Topics Concern  . Not on file  Social History Narrative          Work or School: [employer]              Home Situation: has two adopted children             Spiritual Beliefs: methodist              Lifestyle: no regular exercise, heathy diet                Family History  Problem Relation Age of Onset  . Asthma Mother   . Healthy Father   . Colon cancer Paternal Grandfather   . Lung cancer Maternal Grandfather   . Allergies Sister   . Asthma Sister   . Healthy Brother   . Dementia Paternal  Grandmother     ROS: no fevers or chills, productive cough, hemoptysis, dysphasia, odynophagia, melena, hematochezia, dysuria, hematuria, rash, seizure activity, orthopnea, PND, pedal edema, claudication. Remaining systems are negative.  Physical Exam: Well-developed well-nourished in no acute distress.  Skin is warm and dry.  HEENT is normal.  Neck is supple.  Chest is clear to auscultation with normal expansion.  Cardiovascular exam is regular rate and rhythm.  Abdominal exam nontender or distended. No masses palpated. Extremities show no edema. neuro grossly intact  ECG-sinus rhythm at a rate of 69, nonspecific ST-T wave changes.  Personally reviewed  A/P  1 palpitations-previous monitor showed PACs and PVCs.  We will continue with beta-blocker.  Can increase dose in the future if needed.  2 irritable bowel syndrome-followed by primary care.  3 history of migraine headaches-follow-up primary care.  4 hyperlipidemia-we discussed diet.  Kirk Ruths, MD

## 2018-10-01 ENCOUNTER — Ambulatory Visit: Payer: BC Managed Care – PPO | Admitting: Cardiology

## 2018-10-01 ENCOUNTER — Other Ambulatory Visit: Payer: Self-pay | Admitting: *Deleted

## 2018-10-01 ENCOUNTER — Encounter: Payer: Self-pay | Admitting: Cardiology

## 2018-10-01 ENCOUNTER — Other Ambulatory Visit: Payer: Self-pay

## 2018-10-01 VITALS — BP 124/78 | HR 69 | Temp 97.3°F | Ht 64.0 in | Wt 130.0 lb

## 2018-10-01 DIAGNOSIS — E7800 Pure hypercholesterolemia, unspecified: Secondary | ICD-10-CM

## 2018-10-01 DIAGNOSIS — R002 Palpitations: Secondary | ICD-10-CM

## 2018-10-01 MED ORDER — METOPROLOL SUCCINATE ER 25 MG PO TB24: 25.0000 mg | ORAL_TABLET | Freq: Every day | ORAL | 3 refills | Status: DC

## 2018-10-01 NOTE — Patient Instructions (Signed)

## 2018-10-02 ENCOUNTER — Other Ambulatory Visit: Payer: Self-pay

## 2018-10-02 DIAGNOSIS — R002 Palpitations: Secondary | ICD-10-CM

## 2018-10-02 MED ORDER — METOPROLOL SUCCINATE ER 25 MG PO TB24: 25.0000 mg | ORAL_TABLET | Freq: Every day | ORAL | 3 refills | Status: DC

## 2018-11-05 ENCOUNTER — Encounter: Payer: Self-pay | Admitting: Family Medicine

## 2018-11-05 ENCOUNTER — Other Ambulatory Visit (HOSPITAL_COMMUNITY)
Admission: RE | Admit: 2018-11-05 | Discharge: 2018-11-05 | Disposition: A | Discharge status: Home / Self Care | Payer: BC Managed Care – PPO | Source: Ambulatory Visit | Attending: Family Medicine | Admitting: Family Medicine

## 2018-11-05 ENCOUNTER — Ambulatory Visit (INDEPENDENT_AMBULATORY_CARE_PROVIDER_SITE_OTHER): Payer: BC Managed Care – PPO | Admitting: Family Medicine

## 2018-11-05 ENCOUNTER — Other Ambulatory Visit: Payer: Self-pay

## 2018-11-05 VITALS — BP 108/60 | HR 73 | Temp 97.8°F | Ht 64.0 in | Wt 129.0 lb

## 2018-11-05 DIAGNOSIS — E039 Hypothyroidism, unspecified: Secondary | ICD-10-CM

## 2018-11-05 DIAGNOSIS — Z124 Encounter for screening for malignant neoplasm of cervix: Secondary | ICD-10-CM | POA: Diagnosis not present

## 2018-11-05 DIAGNOSIS — Z0000 Encounter for general adult medical examination without abnormal findings: Secondary | ICD-10-CM | POA: Diagnosis not present

## 2018-11-05 DIAGNOSIS — M419 Scoliosis, unspecified: Secondary | ICD-10-CM | POA: Diagnosis not present

## 2018-11-05 DIAGNOSIS — E785 Hyperlipidemia, unspecified: Secondary | ICD-10-CM

## 2018-11-05 DIAGNOSIS — Z23 Encounter for immunization: Secondary | ICD-10-CM | POA: Diagnosis not present

## 2018-11-05 NOTE — Patient Instructions (Signed)

## 2018-11-05 NOTE — Progress Notes (Signed)
Debra Johnson DOB: Jun 06, 1970 Encounter date: 11/05/2018  This is a 48 y.o. female who presents for complete physical   History of present illness/Additional concerns: More creaky/achy as she gets older. Just wants to be healthy.   Migraine:imitrex prn; sometimes gets these once weekly, but other times monthly. Usually at start of summer with more sleep and change in cycle she will get more migraines.   palpitations: follows with cardiology:metoprolol with extra dose prn rapid HR. Doesn't completely make palpitations go away, but since normal bp is on lower end of normal so if dose increased then her bp will drop.   IBS: only occasionally bothered by this now. About 20 years ago really affected her, but then seemed to settle down and now just out of blue where she will have a few weeks of increased sensitivity. Knows when it happens she just lets things work themselves out.   Mammogram: 08/2018: left breast US needed in 03/2019 to follow up fibroadenomas.   Past Medical History:  Diagnosis Date  . Chicken pox   . Headache(784.0)    frequent  . IBS (irritable bowel syndrome)   . Infertility, female   . Murmur   . Scoliosis    Past Surgical History:  Procedure Laterality Date  . WISDOM TOOTH EXTRACTION     Allergies  Allergen Reactions  . Sulfa Antibiotics Hives    Childhood allergy    Current Meds  Medication Sig  . ibuprofen (ADVIL,MOTRIN) 200 MG tablet Take 200 mg by mouth every 6 (six) hours as needed for moderate pain.  . metoprolol succinate (TOPROL-XL) 25 MG 24 hr tablet Take 1 tablet (25 mg total) by mouth daily.   Social History   Tobacco Use  . Smoking status: Never Smoker  . Smokeless tobacco: Never Used  Substance Use Topics  . Alcohol use: No   Family History  Problem Relation Age of Onset  . Asthma Mother   . Healthy Father   . Colon cancer Paternal Grandfather   . Lung cancer Maternal Grandfather   . Allergies Sister   . Asthma Sister   .  Healthy Brother   . Dementia Paternal Grandmother      Review of Systems  Constitutional: Negative for activity change, appetite change, chills, fatigue, fever and unexpected weight change.  HENT: Negative for congestion, ear pain, hearing loss, sinus pressure, sinus pain, sore throat and trouble swallowing.   Eyes: Negative for pain and visual disturbance.  Respiratory: Negative for cough, chest tightness, shortness of breath and wheezing.   Cardiovascular: Negative for chest pain, palpitations and leg swelling.  Gastrointestinal: Negative for abdominal pain, blood in stool, constipation, diarrhea, nausea and vomiting.  Genitourinary: Negative for difficulty urinating and menstrual problem.  Musculoskeletal: Negative for arthralgias and back pain.  Skin: Negative for rash.  Neurological: Negative for dizziness, weakness, numbness and headaches.  Hematological: Negative for adenopathy. Does not bruise/bleed easily.  Psychiatric/Behavioral: Negative for sleep disturbance and suicidal ideas. The patient is not nervous/anxious.     CBC:  Lab Results  Component Value Date   WBC 5.2 09/23/2018   HGB 13.5 09/23/2018   HCT 40.4 09/23/2018   MCHC 33.4 09/23/2018   RDW 13.3 09/23/2018   PLT 195.0 09/23/2018   CMP: Lab Results  Component Value Date   NA 139 09/23/2018   NA 141 07/29/2017   K 3.9 09/23/2018   CL 102 09/23/2018   CO2 28 09/23/2018   GLUCOSE 110 (H) 09/23/2018   BUN 12 09/23/2018  BUN 6 07/29/2017   CREATININE 0.96 09/23/2018   GFRAA 101 07/29/2017   CALCIUM 9.6 09/23/2018   PROT 6.6 09/23/2018   BILITOT 0.6 09/23/2018   ALKPHOS 79 09/23/2018   ALT 11 09/23/2018   AST 12 09/23/2018   LIPID: Lab Results  Component Value Date   CHOL 218 (H) 09/23/2018   TRIG 83.0 09/23/2018   HDL 54.50 09/23/2018   LDLCALC 147 (H) 09/23/2018    Objective:  BP 108/60 (BP Location: Left Arm, Patient Position: Sitting, Cuff Size: Normal)   Pulse 73   Temp 97.8 F (36.6  C) (Temporal)   Ht 5\' 4"  (1.626 m)   Wt 129 lb (58.5 kg)   LMP 10/14/2018 (Exact Date)   SpO2 98%   BMI 22.14 kg/m   Weight: 129 lb (58.5 kg)   BP Readings from Last 3 Encounters:  11/05/18 108/60  10/01/18 124/78  09/09/17 116/76   Wt Readings from Last 3 Encounters:  11/05/18 129 lb (58.5 kg)  10/01/18 130 lb (59 kg)  09/09/17 130 lb 3.2 oz (59.1 kg)    Physical Exam Exam conducted with a chaperone present.  Constitutional:      General: She is not in acute distress.    Appearance: She is well-developed.  HENT:     Head: Normocephalic and atraumatic.     Right Ear: External ear normal.     Left Ear: External ear normal.     Mouth/Throat:     Pharynx: No oropharyngeal exudate.  Eyes:     Conjunctiva/sclera: Conjunctivae normal.     Pupils: Pupils are equal, round, and reactive to light.  Neck:     Musculoskeletal: Normal range of motion and neck supple.     Thyroid: No thyromegaly.  Cardiovascular:     Rate and Rhythm: Normal rate and regular rhythm.     Heart sounds: Normal heart sounds. No murmur. No friction rub. No gallop.   Pulmonary:     Effort: Pulmonary effort is normal.     Breath sounds: Normal breath sounds.  Abdominal:     General: Bowel sounds are normal. There is no distension.     Palpations: Abdomen is soft. There is no mass.     Tenderness: There is no abdominal tenderness. There is no guarding.     Hernia: No hernia is present. There is no hernia in the left inguinal area or right inguinal area.  Genitourinary:    Exam position: Supine.     Pubic Area: No rash.      Labia:        Right: No rash, tenderness or lesion.        Left: No rash, tenderness or lesion.      Urethra: No urethral swelling.     Vagina: Normal.     Cervix: Normal.     Uterus: Normal.      Adnexa: Right adnexa normal and left adnexa normal.  Musculoskeletal: Normal range of motion.        General: No tenderness or deformity.  Lymphadenopathy:     Cervical: No  cervical adenopathy.     Lower Body: No right inguinal adenopathy. No left inguinal adenopathy.  Skin:    General: Skin is warm and dry.     Findings: No rash.     Comments: No concerning skin lesions noted   Neurological:     Mental Status: She is alert and oriented to person, place, and time.     Deep Tendon Reflexes:  Reflexes normal.     Reflex Scores:      Tricep reflexes are 2+ on the right side and 2+ on the left side.      Bicep reflexes are 2+ on the right side and 2+ on the left side.      Brachioradialis reflexes are 2+ on the right side and 2+ on the left side.      Patellar reflexes are 2+ on the right side and 2+ on the left side. Psychiatric:        Speech: Speech normal.        Behavior: Behavior normal.        Thought Content: Thought content normal.     Assessment/Plan: Health Maintenance Due  Topic Date Due  . PAP SMEAR-Modifier  10/01/2016  . INFLUENZA VACCINE  08/30/2018   Health Maintenance reviewed - will get flu shot today; repeat US breast in feb.  1. Preventative health care Keep up with pilates, regular exercise.   2. Hyperlipidemia, unspecified hyperlipidemia type Slightly elevated. Recheck in 6 months time. She is going to work on diet in meanwhile. - Lipid panel; Future  3. Hypothyroidism, unspecified type Will recheck in 6 months. - TSH; Future - T4, free; Future - T3, free; Future  4. Scoliosis:  Bothers intermittently. Discussed chiropractor or osteopathic tx if desired.  Return for bloodwork in feb 2021.  Micheline Rough, MD

## 2018-11-05 NOTE — Addendum Note (Signed)
Addended by: Agnes Lawrence on: 11/05/2018 03:09 PM   Modules accepted: Orders

## 2018-11-17 LAB — CYTOLOGY - PAP
Adequacy: ABSENT
Comment: NEGATIVE
Diagnosis: NEGATIVE
High risk HPV: NEGATIVE

## 2019-03-09 ENCOUNTER — Other Ambulatory Visit (INDEPENDENT_AMBULATORY_CARE_PROVIDER_SITE_OTHER): Payer: BC Managed Care – PPO

## 2019-03-09 ENCOUNTER — Other Ambulatory Visit: Payer: Self-pay

## 2019-03-09 DIAGNOSIS — E785 Hyperlipidemia, unspecified: Secondary | ICD-10-CM

## 2019-03-09 DIAGNOSIS — E039 Hypothyroidism, unspecified: Secondary | ICD-10-CM | POA: Diagnosis not present

## 2019-03-09 LAB — LIPID PANEL
Cholesterol: 175 mg/dL (ref 0–200)
HDL: 46.9 mg/dL (ref >39.00)
LDL Cholesterol: 111 mg/dL — ABNORMAL HIGH (ref 0–99)
NonHDL: 127.83
Total CHOL/HDL Ratio: 4
Triglycerides: 83 mg/dL (ref 0.0–149.0)
VLDL: 16.6 mg/dL (ref 0.0–40.0)

## 2019-03-09 LAB — T4, FREE: Free T4: 0.64 ng/dL (ref 0.60–1.60)

## 2019-03-09 LAB — TSH: TSH: 3.67 u[IU]/mL (ref 0.35–4.50)

## 2019-03-09 LAB — T3, FREE: T3, Free: 4.5 pg/mL — ABNORMAL HIGH (ref 2.3–4.2)

## 2019-03-11 ENCOUNTER — Other Ambulatory Visit: Payer: BC Managed Care – PPO

## 2019-03-13 ENCOUNTER — Ambulatory Visit: Payer: BC Managed Care – PPO

## 2019-03-13 ENCOUNTER — Ambulatory Visit
Admission: RE | Admit: 2019-03-13 | Discharge: 2019-03-13 | Disposition: A | Discharge status: Home / Self Care | Payer: BC Managed Care – PPO | Source: Ambulatory Visit | Attending: Family Medicine | Admitting: Family Medicine

## 2019-03-13 ENCOUNTER — Other Ambulatory Visit: Payer: Self-pay | Admitting: Family Medicine

## 2019-03-13 ENCOUNTER — Other Ambulatory Visit: Payer: Self-pay

## 2019-03-13 DIAGNOSIS — N6320 Unspecified lump in the left breast, unspecified quadrant: Secondary | ICD-10-CM

## 2019-08-26 ENCOUNTER — Ambulatory Visit: Payer: BC Managed Care – PPO | Admitting: Family Medicine

## 2019-08-26 ENCOUNTER — Telehealth: Payer: Self-pay | Admitting: Family Medicine

## 2019-08-26 ENCOUNTER — Encounter: Payer: Self-pay | Admitting: Family Medicine

## 2019-08-26 ENCOUNTER — Other Ambulatory Visit: Payer: Self-pay

## 2019-08-26 VITALS — BP 118/60 | HR 74 | Temp 98.7°F | Wt 130.7 lb

## 2019-08-26 DIAGNOSIS — S91312A Laceration without foreign body, left foot, initial encounter: Secondary | ICD-10-CM

## 2019-08-26 NOTE — Patient Instructions (Signed)
Keep wound clean daily with soap and water  Continue small amount of vaseline or Neosporin daily  Follow up for any increased redness, swelling, or drainage  Tetanus is up date.

## 2019-08-26 NOTE — Progress Notes (Signed)
Established Patient Office Visit  Subjective:  Patient ID: Debra Johnson, female    DOB: 06-Mar-1970  Age: 49 y.o. MRN: [id number]  CC:  Chief Complaint  Patient presents with  . Laceration    on top of left foot hit with door yesterday door is metal but only a year old     HPI Debra Johnson presents for small flap laceration left foot dorsally at the base of the third toe.  She has a new storm door and her foot was caught up underneath the storm door as this was closing.  This injury occurred around 4:30 PM yesterday.  She had some initial bleeding.  She noted a little bit of bleeding with walking since then.  She has been applying some Neosporin.  Her tetanus is up-to-date.  No other injuries reported.  Past Medical History:  Diagnosis Date  . Chicken pox   . Headache(784.0)    frequent  . IBS (irritable bowel syndrome)   . Infertility, female   . Murmur   . Scoliosis     Past Surgical History:  Procedure Laterality Date  . WISDOM TOOTH EXTRACTION      Family History  Problem Relation Age of Onset  . Asthma Mother   . Healthy Father   . Colon cancer Paternal Grandfather   . Lung cancer Maternal Grandfather   . Allergies Sister   . Asthma Sister   . Healthy Brother   . Dementia Paternal Grandmother     Social History   Socioeconomic History  . Marital status: Married    Spouse name: Not on file  . Number of children: 2  . Years of education: Not on file  . Highest education level: Not on file  Occupational History    Comment: First grade teacher  Tobacco Use  . Smoking status: Never Smoker  . Smokeless tobacco: Never Used  Substance and Sexual Activity  . Alcohol use: No  . Drug use: No  . Sexual activity: Yes  Other Topics Concern  . Not on file  Social History Narrative          Work or School: [employer]              Home Situation: has two adopted children             Spiritual Beliefs: methodist              Lifestyle: no  regular exercise, heathy diet               Social Determinants of Health   Financial Resource Strain:   . Difficulty of Paying Living Expenses:   Food Insecurity:   . Worried About Charity fundraiser in the Last Year:   . Arboriculturist in the Last Year:   Transportation Needs:   . Film/video editor (Medical):   Marland Kitchen Lack of Transportation (Non-Medical):   Physical Activity:   . Days of Exercise per Week:   . Minutes of Exercise per Session:   Stress:   . Feeling of Stress :   Social Connections:   . Frequency of Communication with Friends and Family:   . Frequency of Social Gatherings with Friends and Family:   . Attends Religious Services:   . Active Member of Clubs or Organizations:   . Attends Archivist Meetings:   Marland Kitchen Marital Status:   Intimate Partner Violence:   . Fear of Current or Ex-Partner:   .  Emotionally Abused:   Marland Kitchen Physically Abused:   . Sexually Abused:     Outpatient Medications Prior to Visit  Medication Sig Dispense Refill  . ibuprofen (ADVIL,MOTRIN) 200 MG tablet Take 200 mg by mouth every 6 (six) hours as needed for moderate pain.    . metoprolol succinate (TOPROL-XL) 25 MG 24 hr tablet Take 1 tablet (25 mg total) by mouth daily. 90 tablet 3   No facility-administered medications prior to visit.    Allergies  Allergen Reactions  . Sulfa Antibiotics Hives    Childhood allergy     ROS Review of Systems  Constitutional: Negative for chills and fever.      Objective:    Physical Exam Musculoskeletal:     Comments: Left foot reveals no bony tenderness.  No edema. No ecchymosis.  Skin:    Comments: She has small flap-like laceration at the base of the left third toe.  No active bleeding.  No foreign bodies noted.  Skin is lying down in place very nicely at this time.  This is nongaping.  No surrounding erythema     BP (!) 118/60 (BP Location: Left Arm, Patient Position: Sitting, Cuff Size: Normal)   Pulse 74   Temp 98.7  F (37.1 C) (Oral)   Wt 130 lb 11.2 oz (59.3 kg)   SpO2 96%   BMI 22.43 kg/m  Wt Readings from Last 3 Encounters:  08/26/19 130 lb 11.2 oz (59.3 kg)  11/05/18 129 lb (58.5 kg)  10/01/18 130 lb (59 kg)     Health Maintenance Due  Topic Date Due  . Hepatitis C Screening  Never done  . HIV Screening  Never done  . COLONOSCOPY  Never done    There are no preventive care reminders to display for this patient.  Lab Results  Component Value Date   TSH 3.67 03/09/2019   Lab Results  Component Value Date   WBC 5.2 09/23/2018   HGB 13.5 09/23/2018   HCT 40.4 09/23/2018   MCV 89.1 09/23/2018   PLT 195.0 09/23/2018   Lab Results  Component Value Date   NA 139 09/23/2018   K 3.9 09/23/2018   CO2 28 09/23/2018   GLUCOSE 110 (H) 09/23/2018   BUN 12 09/23/2018   CREATININE 0.96 09/23/2018   BILITOT 0.6 09/23/2018   ALKPHOS 79 09/23/2018   AST 12 09/23/2018   ALT 11 09/23/2018   PROT 6.6 09/23/2018   ALBUMIN 4.4 09/23/2018   CALCIUM 9.6 09/23/2018   GFR 61.69 09/23/2018   Lab Results  Component Value Date   CHOL 175 03/09/2019   Lab Results  Component Value Date   HDL 46.90 03/09/2019   Lab Results  Component Value Date   LDLCALC 111 (H) 03/09/2019   Lab Results  Component Value Date   TRIG 83.0 03/09/2019   Lab Results  Component Value Date   CHOLHDL 4 03/09/2019   Lab Results  Component Value Date   HGBA1C 5.7 09/29/2018      Assessment & Plan:   Small flap-like laceration dorsum left foot.  This occurred over 24 hours ago.  Would not recommend any suturing.  Fortunately this is nongaping and is fairly small superficial laceration.  No signs of secondary infection  -Gently clean daily with soap and water -Continue with a little bit of Vaseline or topical antibiotic daily until healed -Continue to keep covered because the location and increased risk of opening back up the wound -Follow-up promptly for any signs of  secondary infection  No orders of  the defined types were placed in this encounter.   Follow-up: No follow-ups on file.    Carolann Littler, MD

## 2019-08-26 NOTE — Telephone Encounter (Signed)
Patient called yesterday and talked with a Triage nurse for a cut on the bottom of her foot from the storm door and was told to contact the office to get a Tetanus shot.  The patient was wanting to know when was the last time she had a tetanus shot and wanted to know if she needs to be seen.  Please advise

## 2019-08-26 NOTE — Telephone Encounter (Signed)
Appt scheduled for today at 4:15pm.

## 2019-08-26 NOTE — Telephone Encounter (Signed)
I am happy to see her

## 2019-09-11 ENCOUNTER — Ambulatory Visit
Admission: RE | Admit: 2019-09-11 | Discharge: 2019-09-11 | Disposition: A | Discharge status: Home / Self Care | Payer: BC Managed Care – PPO | Source: Ambulatory Visit | Attending: Family Medicine | Admitting: Family Medicine

## 2019-09-11 ENCOUNTER — Other Ambulatory Visit: Payer: Self-pay

## 2019-09-11 DIAGNOSIS — N6320 Unspecified lump in the left breast, unspecified quadrant: Secondary | ICD-10-CM

## 2019-09-15 ENCOUNTER — Other Ambulatory Visit: Payer: Self-pay | Admitting: *Deleted

## 2019-09-15 DIAGNOSIS — N6320 Unspecified lump in the left breast, unspecified quadrant: Secondary | ICD-10-CM

## 2019-11-09 ENCOUNTER — Other Ambulatory Visit: Payer: Self-pay

## 2019-11-09 ENCOUNTER — Ambulatory Visit (INDEPENDENT_AMBULATORY_CARE_PROVIDER_SITE_OTHER): Payer: BC Managed Care – PPO | Admitting: Family Medicine

## 2019-11-09 ENCOUNTER — Encounter: Payer: Self-pay | Admitting: Family Medicine

## 2019-11-09 VITALS — BP 120/78 | HR 73 | Temp 98.1°F | Ht 64.25 in | Wt 131.4 lb

## 2019-11-09 DIAGNOSIS — R7989 Other specified abnormal findings of blood chemistry: Secondary | ICD-10-CM

## 2019-11-09 DIAGNOSIS — Z0000 Encounter for general adult medical examination without abnormal findings: Secondary | ICD-10-CM

## 2019-11-09 DIAGNOSIS — Z1211 Encounter for screening for malignant neoplasm of colon: Secondary | ICD-10-CM | POA: Diagnosis not present

## 2019-11-09 DIAGNOSIS — Z23 Encounter for immunization: Secondary | ICD-10-CM

## 2019-11-09 NOTE — Progress Notes (Signed)
Debra Johnson DOB: 1970/12/10 Encounter date: 11/09/2019  This is a 49 y.o. female who presents for complete physical   History of present illness/Additional concerns: Always somewhat anxious, but nothing specifically concerning.   Has stress fracture right foot; foot was hurting by third week of school and saw ortho. Appeared to have stress fracture in right foot. Wearing boot and has follow up last this month. Hasn't had other stress fractures.   Palpitations: Metoprolol 25 mg daily. No major changes in these. Feels she is pretty in tune with these. Not getting significant racing; just notes a funny beat and a quick flutter. Nothing prolonged. Has appointment in November.   Last mammogram: 09/11/2019: Stable fibroadenomas on the left breast.  1 year follow-up with ultrasound recommended.   Due for colonoscopy  T3 elevated on last blood work in February/2021.  Sometimes at end of week for school starts to have slightly sore throat - like in nasopharynx. Usually better over weekend, but worse when she goes back to school; never gets worse.   Past Medical History:  Diagnosis Date  . Chicken pox   . Headache(784.0)    frequent  . IBS (irritable bowel syndrome)   . Infertility, female   . Murmur   . Scoliosis    Past Surgical History:  Procedure Laterality Date  . WISDOM TOOTH EXTRACTION     Allergies  Allergen Reactions  . Sulfa Antibiotics Hives    Childhood allergy    Current Meds  Medication Sig  . ibuprofen (ADVIL,MOTRIN) 200 MG tablet Take 200 mg by mouth every 6 (six) hours as needed for moderate pain.  . metoprolol succinate (TOPROL-XL) 25 MG 24 hr tablet Take 1 tablet (25 mg total) by mouth daily.   Social History   Tobacco Use  . Smoking status: Never Smoker  . Smokeless tobacco: Never Used  Substance Use Topics  . Alcohol use: No   Family History  Problem Relation Age of Onset  . Asthma Mother   . Healthy Father   . Colon cancer Paternal Grandfather    . Lung cancer Maternal Grandfather   . Allergies Sister   . Asthma Sister   . Healthy Brother   . Dementia Paternal Grandmother      Review of Systems  Constitutional: Negative for activity change, appetite change, chills, fatigue, fever and unexpected weight change.  HENT: Negative for congestion, ear pain, hearing loss, sinus pressure, sinus pain, sore throat and trouble swallowing.   Eyes: Negative for pain and visual disturbance.  Respiratory: Negative for cough, chest tightness, shortness of breath and wheezing.   Cardiovascular: Negative for chest pain, palpitations and leg swelling.  Gastrointestinal: Negative for abdominal pain, blood in stool, constipation, diarrhea, nausea and vomiting.  Genitourinary: Negative for difficulty urinating and menstrual problem.  Musculoskeletal: Negative for arthralgias and back pain.  Skin: Negative for rash.  Neurological: Negative for dizziness, weakness, numbness and headaches.  Hematological: Negative for adenopathy. Does not bruise/bleed easily.  Psychiatric/Behavioral: Negative for sleep disturbance and suicidal ideas. The patient is not nervous/anxious.     CBC:  Lab Results  Component Value Date   WBC 5.2 09/23/2018   HGB 13.5 09/23/2018   HCT 40.4 09/23/2018   MCHC 33.4 09/23/2018   RDW 13.3 09/23/2018   PLT 195.0 09/23/2018   CMP: Lab Results  Component Value Date   NA 139 09/23/2018   NA 141 07/29/2017   K 3.9 09/23/2018   CL 102 09/23/2018   CO2 28 09/23/2018  GLUCOSE 110 (H) 09/23/2018   BUN 12 09/23/2018   BUN 6 07/29/2017   CREATININE 0.96 09/23/2018   GFRAA 101 07/29/2017   CALCIUM 9.6 09/23/2018   PROT 6.6 09/23/2018   BILITOT 0.6 09/23/2018   ALKPHOS 79 09/23/2018   ALT 11 09/23/2018   AST 12 09/23/2018   LIPID: Lab Results  Component Value Date   CHOL 175 03/09/2019   TRIG 83.0 03/09/2019   HDL 46.90 03/09/2019   LDLCALC 111 (H) 03/09/2019    Objective:  BP 120/78 (BP Location: Left Arm,  Patient Position: Sitting, Cuff Size: Normal)   Pulse 73   Temp 98.1 F (36.7 C) (Oral)   Ht 5' 4.25" (1.632 m)   Wt 131 lb 6.4 oz (59.6 kg)   LMP 11/01/2019 (Exact Date)   BMI 22.38 kg/m   Weight: 131 lb 6.4 oz (59.6 kg)   BP Readings from Last 3 Encounters:  11/09/19 120/78  08/26/19 (!) 118/60  11/05/18 108/60   Wt Readings from Last 3 Encounters:  11/09/19 131 lb 6.4 oz (59.6 kg)  08/26/19 130 lb 11.2 oz (59.3 kg)  11/05/18 129 lb (58.5 kg)    Physical Exam Constitutional:      General: She is not in acute distress.    Appearance: She is well-developed.  HENT:     Head: Normocephalic and atraumatic.     Right Ear: External ear normal.     Left Ear: External ear normal.     Mouth/Throat:     Pharynx: No oropharyngeal exudate.  Eyes:     Conjunctiva/sclera: Conjunctivae normal.     Pupils: Pupils are equal, round, and reactive to light.  Neck:     Thyroid: No thyromegaly.  Cardiovascular:     Rate and Rhythm: Normal rate and regular rhythm.     Heart sounds: Normal heart sounds. No murmur heard.  No friction rub. No gallop.   Pulmonary:     Effort: Pulmonary effort is normal.     Breath sounds: Normal breath sounds.  Abdominal:     General: Bowel sounds are normal. There is no distension.     Palpations: Abdomen is soft. There is no mass.     Tenderness: There is no abdominal tenderness. There is no guarding.     Hernia: No hernia is present.  Musculoskeletal:        General: No tenderness or deformity. Normal range of motion.     Cervical back: Normal range of motion and neck supple.  Lymphadenopathy:     Cervical: No cervical adenopathy.  Skin:    General: Skin is warm and dry.     Findings: No rash.  Neurological:     Mental Status: She is alert and oriented to person, place, and time.     Deep Tendon Reflexes: Reflexes normal.     Reflex Scores:      Tricep reflexes are 2+ on the right side and 2+ on the left side.      Bicep reflexes are 2+ on the  right side and 2+ on the left side.      Brachioradialis reflexes are 2+ on the right side and 2+ on the left side.      Patellar reflexes are 2+ on the right side and 2+ on the left side. Psychiatric:        Speech: Speech normal.        Behavior: Behavior normal.        Thought Content: Thought content normal.  Assessment/Plan: Health Maintenance Due  Topic Date Due  . COLONOSCOPY  Never done  . INFLUENZA VACCINE  08/30/2019   Health Maintenance reviewed.  1. Preventative health care Referring for colonoscopy.  - Hepatitis C antibody; Future  2. Screening for colon cancer - Ambulatory referral to Gastroenterology  3. High serum triiodothyronine (T3) - TSH; Future - T4, free; Future - T3, free; Future  Return in about 1 year (around 11/08/2020) for physical exam.  Micheline Rough, MD

## 2019-11-09 NOTE — Addendum Note (Signed)
Addended by: Agnes Lawrence on: 11/09/2019 08:56 AM   Modules accepted: Orders

## 2019-11-10 LAB — HEPATITIS C ANTIBODY
Hepatitis C Ab: NONREACTIVE
SIGNAL TO CUT-OFF: 0.01 (ref <1.00)

## 2019-11-10 LAB — T4, FREE: Free T4: 1 ng/dL (ref 0.8–1.8)

## 2019-11-10 LAB — T3, FREE: T3, Free: 3.6 pg/mL (ref 2.3–4.2)

## 2019-11-10 LAB — TSH: TSH: 2.85 mIU/L

## 2019-12-02 NOTE — Progress Notes (Signed)
HPI: FU palpitations. Echocardiogram July 2019 showed normal LV function, mild mitral regurgitation.  Monitor July 2019 showed sinus rhythm with rare PAC and PVC.  Since I last saw her,she denies dyspnea, chest pain or syncope.  She continues to have spells of palpitations described as a brief skip or flutter.  Current Outpatient Medications  Medication Sig Dispense Refill   ibuprofen (ADVIL,MOTRIN) 200 MG tablet Take 200 mg by mouth every 6 (six) hours as needed for moderate pain.     metoprolol succinate (TOPROL-XL) 25 MG 24 hr tablet Take 1 tablet (25 mg total) by mouth daily. 90 tablet 3   No current facility-administered medications for this visit.     Past Medical History:  Diagnosis Date   Chicken pox    Headache(784.0)    frequent   IBS (irritable bowel syndrome)    Infertility, female    Murmur    Scoliosis     Past Surgical History:  Procedure Laterality Date   WISDOM TOOTH EXTRACTION      Social History   Socioeconomic History   Marital status: Married    Spouse name: Not on file   Number of children: 2   Years of education: Not on file   Highest education level: Not on file  Occupational History    Comment: First grade teacher  Tobacco Use   Smoking status: Never Smoker   Smokeless tobacco: Never Used  Substance and Sexual Activity   Alcohol use: No   Drug use: No   Sexual activity: Yes  Other Topics Concern   Not on file  Social History Narrative          Work or School: [employer]              Home Situation: has two adopted children             Spiritual Beliefs: methodist              Lifestyle: no regular exercise, heathy diet               Social Determinants of Health   Financial Resource Strain:    Difficulty of Paying Living Expenses: Not on file  Food Insecurity:    Worried About Charity fundraiser in the Last Year: Not on file   [employer] of Food in the Last Year: Not on file    Transportation Needs:    Lack of Transportation (Medical): Not on file   Lack of Transportation (Non-Medical): Not on file  Physical Activity:    Days of Exercise per Week: Not on file   Minutes of Exercise per Session: Not on file  Stress:    Feeling of Stress : Not on file  Social Connections:    Frequency of Communication with Friends and Family: Not on file   Frequency of Social Gatherings with Friends and Family: Not on file   Attends Religious Services: Not on file   Active Member of Clubs or Organizations: Not on file   Attends Archivist Meetings: Not on file   Marital Status: Not on file  Intimate Partner Violence:    Fear of Current or Ex-Partner: Not on file   Emotionally Abused: Not on file   Physically Abused: Not on file   Sexually Abused: Not on file    Family History  Problem Relation Age of Onset   Asthma Mother    Healthy Father    Colon cancer  Paternal Grandfather    Lung cancer Maternal Grandfather    Allergies Sister    Asthma Sister    Healthy Brother    Dementia Paternal Grandmother     ROS: no fevers or chills, productive cough, hemoptysis, dysphasia, odynophagia, melena, hematochezia, dysuria, hematuria, rash, seizure activity, orthopnea, PND, pedal edema, claudication. Remaining systems are negative.  Physical Exam: Well-developed well-nourished in no acute distress.  Skin is warm and dry.  HEENT is normal.  Neck is supple.  Chest is clear to auscultation with normal expansion.  Cardiovascular exam is regular rate and rhythm.  Abdominal exam nontender or distended. No masses palpated. Extremities show no edema. neuro grossly intact  ECG-normal sinus rhythm at a rate of 75, right axis deviation, nonspecific ST changes.  Personally reviewed  A/P  1 palpitations-as outlined in previous notes this is felt secondary to PACs and PVCs.  Continue beta-blocker.  Symptoms are reasonably well controlled.  We also  discussed the possibility of an apple watch to record her symptoms.  2 hyperlipidemia-continue diet.  3 history of migraines and irritable bowel syndrome-follow-up primary care.  Kirk Ruths, MD

## 2019-12-10 ENCOUNTER — Encounter: Payer: Self-pay | Admitting: Cardiology

## 2019-12-10 ENCOUNTER — Ambulatory Visit: Payer: BC Managed Care – PPO | Admitting: Cardiology

## 2019-12-10 ENCOUNTER — Other Ambulatory Visit: Payer: Self-pay

## 2019-12-10 VITALS — BP 120/73 | HR 75 | Temp 97.3°F | Ht 64.0 in | Wt 129.6 lb

## 2019-12-10 DIAGNOSIS — R002 Palpitations: Secondary | ICD-10-CM

## 2019-12-10 DIAGNOSIS — E7800 Pure hypercholesterolemia, unspecified: Secondary | ICD-10-CM

## 2019-12-10 MED ORDER — METOPROLOL SUCCINATE ER 25 MG PO TB24: 25.0000 mg | ORAL_TABLET | Freq: Every day | ORAL | 3 refills | Status: DC

## 2019-12-10 NOTE — Patient Instructions (Signed)

## 2019-12-16 ENCOUNTER — Encounter: Payer: Self-pay | Admitting: Gastroenterology

## 2020-02-03 ENCOUNTER — Ambulatory Visit (AMBULATORY_SURGERY_CENTER): Payer: Self-pay | Admitting: *Deleted

## 2020-02-03 ENCOUNTER — Other Ambulatory Visit: Payer: Self-pay

## 2020-02-03 VITALS — Ht 64.0 in | Wt 132.0 lb

## 2020-02-03 DIAGNOSIS — Z1211 Encounter for screening for malignant neoplasm of colon: Secondary | ICD-10-CM

## 2020-02-03 MED ORDER — PLENVU 140 G PO SOLR: 1.0000 | Freq: Once | ORAL | 0 refills | Status: AC

## 2020-02-03 NOTE — Progress Notes (Signed)
Completed covid vaccines  Takes Metoprolol for palpitations, not HTN  Pt is aware that care partner will wait in the car during procedure; if they feel like they will be too hot or cold to wait in the car; they may wait in the 4 th floor lobby. Patient is aware to bring only one care partner. We want them to wear a mask (we do not have any that we can provide them), practice social distancing, and we will check their temperatures when they get here.  I did remind the patient that their care partner needs to stay in the parking lot the entire time and have a cell phone available, we will call them when the pt is ready for discharge. Patient will wear mask into building.   No trouble with anesthesia, denies difficulty moving neck, or hx/fam hx of malignant hyperthermia per pt   No egg or soy allergy  No home oxygen use   No medications for weight loss taken  emmi information given  Pt denies constipation issues  Plenvu coupon given and code put into RX

## 2020-02-10 ENCOUNTER — Encounter: Payer: Self-pay | Admitting: Gastroenterology

## 2020-02-11 ENCOUNTER — Telehealth: Payer: Self-pay | Admitting: Gastroenterology

## 2020-02-11 NOTE — Telephone Encounter (Signed)
Hi Dr. Fuller Plan., this patient just called to cancel procedure that was scheduled on 02/17/20 because of the weather and also she was not able to find a substitute teacher. Patient has rescheduled to 03/23/20. Thank you.

## 2020-02-17 ENCOUNTER — Encounter: Payer: BC Managed Care – PPO | Admitting: Gastroenterology

## 2020-03-03 ENCOUNTER — Other Ambulatory Visit: Payer: Self-pay

## 2020-03-04 ENCOUNTER — Ambulatory Visit (INDEPENDENT_AMBULATORY_CARE_PROVIDER_SITE_OTHER): Payer: BC Managed Care – PPO | Admitting: *Deleted

## 2020-03-04 DIAGNOSIS — Z23 Encounter for immunization: Secondary | ICD-10-CM | POA: Diagnosis not present

## 2020-03-23 ENCOUNTER — Encounter: Payer: BC Managed Care – PPO | Admitting: Gastroenterology

## 2020-05-05 ENCOUNTER — Other Ambulatory Visit: Payer: Self-pay

## 2020-05-05 ENCOUNTER — Ambulatory Visit (AMBULATORY_SURGERY_CENTER): Payer: Self-pay

## 2020-05-05 VITALS — Ht 64.0 in | Wt 128.0 lb

## 2020-05-05 DIAGNOSIS — Z1211 Encounter for screening for malignant neoplasm of colon: Secondary | ICD-10-CM

## 2020-05-05 NOTE — Progress Notes (Signed)
No egg or soy allergy known to patient  No issues with past sedation with any surgeries or procedures Patient denies ever being told they had issues or difficulty with intubation  No FH of Malignant Hyperthermia No diet pills per patient No home 02 use per patient  No blood thinners per patient  Pt reports issues with constipation -- will advise patient to complete 2 day prep for procedure No A fib or A flutter  EMMI video via MyChart  COVID 19 guidelines implemented in PV today with Pt and RN  Due to the COVID-19 pandemic we are asking patients to follow certain guidelines.  Pt aware of COVID protocols and [place] guidelines  Patient already has Plenvu prep from previous PV appt;

## 2020-05-11 ENCOUNTER — Encounter: Payer: Self-pay | Admitting: Gastroenterology

## 2020-05-16 ENCOUNTER — Telehealth: Payer: Self-pay | Admitting: Gastroenterology

## 2020-05-16 NOTE — Telephone Encounter (Signed)
Encouraged her to complete prep as instructed.   I encouraged her push fluids and drink fluids with electrolytes such as Gatorade and Pedialyte.  Understanding voiced

## 2020-05-16 NOTE — Telephone Encounter (Signed)
Inbound call from patient requesting a call from a nurse lease.  States she has been going to the restroom nonstop since taking first prep and states maybe second prep not necesary.  Please advise.

## 2020-05-17 ENCOUNTER — Other Ambulatory Visit: Payer: Self-pay

## 2020-05-17 ENCOUNTER — Ambulatory Visit (AMBULATORY_SURGERY_CENTER): Payer: BC Managed Care – PPO | Admitting: Gastroenterology

## 2020-05-17 ENCOUNTER — Encounter: Payer: Self-pay | Admitting: Gastroenterology

## 2020-05-17 VITALS — BP 96/66 | HR 97 | Temp 98.0°F | Resp 14 | Ht 64.0 in | Wt 128.0 lb

## 2020-05-17 DIAGNOSIS — Z1211 Encounter for screening for malignant neoplasm of colon: Secondary | ICD-10-CM | POA: Diagnosis present

## 2020-05-17 DIAGNOSIS — D124 Benign neoplasm of descending colon: Secondary | ICD-10-CM

## 2020-05-17 DIAGNOSIS — D125 Benign neoplasm of sigmoid colon: Secondary | ICD-10-CM | POA: Diagnosis not present

## 2020-05-17 MED ORDER — SODIUM CHLORIDE 0.9 % IV SOLN: 500.0000 mL | Freq: Once | INTRAVENOUS | Status: DC

## 2020-05-17 NOTE — Progress Notes (Signed)
VS-CW  Pt's states no medical or surgical changes since previsit or office visit.  

## 2020-05-17 NOTE — Progress Notes (Signed)
Called to room to assist during endoscopic procedure.  Patient ID and intended procedure confirmed with present staff. Received instructions for my participation in the procedure from the performing physician.  

## 2020-05-17 NOTE — Progress Notes (Signed)
To PACU, VSS. Report to [email or web address] 

## 2020-05-17 NOTE — Op Note (Signed)
[facility] Patient Name: Debra Johnson Procedure Date: 05/17/2020 1:23 PM MRN: [id number] Endoscopist: Ladene Artist , MD Age: 50 Referring MD:  Date of Birth: 05-05-1970 Gender: Female Account #: [id number] Procedure:                Colonoscopy Indications:              Screening for colorectal malignant neoplasm Medicines:                Monitored Anesthesia Care Procedure:                Pre-Anesthesia Assessment:                           - Prior to the procedure, a History and Physical                            was performed, and patient medications and                            allergies were reviewed. The patient's tolerance of                            previous anesthesia was also reviewed. The risks                            and benefits of the procedure and the sedation                            options and risks were discussed with the patient.                            All questions were answered, and informed consent                            was obtained. Prior Anticoagulants: The patient has                            taken no previous anticoagulant or antiplatelet                            agents. ASA Grade Assessment: II - A patient with                            mild systemic disease. After reviewing the risks                            and benefits, the patient was deemed in                            satisfactory condition to undergo the procedure.                           After obtaining informed consent, the colonoscope  was passed under direct vision. Throughout the                            procedure, the patient's blood pressure, pulse, and                            oxygen saturations were monitored continuously. The                            Olympus PCF-H190DL ([id number]) Colonoscope was                            introduced through the anus and advanced to the the                            cecum,  identified by appendiceal orifice and                            ileocecal valve. The ileocecal valve, appendiceal                            orifice, and rectum were photographed. The quality                            of the bowel preparation was good. The colonoscopy                            was performed without difficulty. The patient                            tolerated the procedure well. Scope In: 1:33:15 PM Scope Out: 1:53:22 PM Scope Withdrawal Time: 0 hours 16 minutes 50 seconds  Total Procedure Duration: 0 hours 20 minutes 7 seconds  Findings:                 The perianal and digital rectal examinations were                            normal.                           Two sessile polyps were found in the sigmoid colon                            and descending colon. The polyps were 5 to 7 mm in                            size. These polyps were removed with a cold snare.                            Resection and retrieval were complete.                           Internal hemorrhoids were found during  retroflexion. The hemorrhoids were small and Grade                            I (internal hemorrhoids that do not prolapse).                           The exam was otherwise without abnormality on                            direct and retroflexion views. Complications:            No immediate complications. Estimated blood loss:                            None. Estimated Blood Loss:     Estimated blood loss: none. Impression:               - Two 5 to 7 mm polyps in the sigmoid colon and in                            the descending colon, removed with a cold snare.                            Resected and retrieved.                           - Internal hemorrhoids.                           - The examination was otherwise normal on direct                            and retroflexion views. Recommendation:           - Repeat colonoscopy after studies are  complete for                            surveillance based on pathology results.                           - Patient has a contact number available for                            emergencies. The signs and symptoms of potential                            delayed complications were discussed with the                            patient. Return to normal activities tomorrow.                            Written discharge instructions were provided to the                            patient.                           -  Resume previous diet.                           - Continue present medications.                           - Await pathology results. Ladene Artist, MD 05/17/2020 1:55:27 PM This report has been signed electronically.

## 2020-05-17 NOTE — Patient Instructions (Signed)
Read all of the handouts given to you by your recovery room nurse.  YOU HAD AN ENDOSCOPIC PROCEDURE TODAY AT THE [facility]:   Refer to the procedure report that was given to you for any specific questions about what was found during the examination.  If the procedure report does not answer your questions, please call your gastroenterologist to clarify.  If you requested that your care partner not be given the details of your procedure findings, then the procedure report has been included in a sealed envelope for you to review at your convenience later.  YOU SHOULD EXPECT: Some feelings of bloating in the abdomen. Passage of more gas than usual.  Walking can help get rid of the air that was put into your GI tract during the procedure and reduce the bloating. If you had a lower endoscopy (such as a colonoscopy or flexible sigmoidoscopy) you may notice spotting of blood in your stool or on the toilet paper. If you underwent a bowel prep for your procedure, you may not have a normal bowel movement for a few days.  Please Note:  You might notice some irritation and congestion in your nose or some drainage.  This is from the oxygen used during your procedure.  There is no need for concern and it should clear up in a day or so.  SYMPTOMS TO REPORT IMMEDIATELY:  Following lower endoscopy (colonoscopy or flexible sigmoidoscopy):  Excessive amounts of blood in the stool  Significant tenderness or worsening of abdominal pains  Swelling of the abdomen that is new, acute  Fever of 100F or higher   For urgent or emergent issues, a gastroenterologist can be reached at any hour by calling [phone]. Do not use MyChart messaging for urgent concerns.    DIET:  We do recommend a small meal at first, but then you may proceed to your regular diet.  Drink plenty of fluids but you should avoid alcoholic beverages for 24 hours. Try to increase the fiber in your diet, and drink plenty of  water.  ACTIVITY:  You should plan to take it easy for the rest of today and you should NOT DRIVE or use heavy machinery until tomorrow (because of the sedation medicines used during the test).    FOLLOW UP: Our staff will call the number listed on your records 48-72 hours following your procedure to check on you and address any questions or concerns that you may have regarding the information given to you following your procedure. If we do not reach you, we will leave a message.  We will attempt to reach you two times.  During this call, we will ask if you have developed any symptoms of COVID 19. If you develop any symptoms (ie: fever, flu-like symptoms, shortness of breath, cough etc.) before then, please call [phone].  If you test positive for Covid 19 in the 2 weeks post procedure, please call and report this information to us.    If any biopsies were taken you will be contacted by phone or by letter within the next 1-3 weeks.  Please call us at [phone] if you have not heard about the biopsies in 3 weeks.    SIGNATURES/CONFIDENTIALITY: You and/or your care partner have signed paperwork which will be entered into your electronic medical record.  These signatures attest to the fact that that the information above on your After Visit Summary has been reviewed and is understood.  Full responsibility of the confidentiality of this   discharge information lies with you and/or your care-partner.  

## 2020-05-19 ENCOUNTER — Telehealth: Payer: Self-pay | Admitting: *Deleted

## 2020-05-19 NOTE — Telephone Encounter (Signed)
  Follow up Call-  Call back number 05/17/2020  Post procedure Call Back phone  # [phone]  Permission to leave phone message Yes  Some recent data might be hidden     Patient questions:  Do you have a fever, pain , or abdominal swelling? No. Pain Score  0 *  Have you tolerated food without any problems? Yes.    Have you been able to return to your normal activities? Yes.    Do you have any questions about your discharge instructions: Diet   No. Medications  No. Follow up visit  No.  Do you have questions or concerns about your Care? No.  Actions: * If pain score is 4 or above: 1. No action needed, pain <4.Have you developed a fever since your procedure? no  2.   Have you had an respiratory symptoms (SOB or cough) since your procedure? no  3.   Have you tested positive for COVID 19 since your procedure no  4.   Have you had any family members/close contacts diagnosed with the COVID 19 since your procedure?  no   If yes to any of these questions please route to Joylene John, RN and Joella Prince, RN

## 2020-05-30 ENCOUNTER — Encounter: Payer: Self-pay | Admitting: Gastroenterology

## 2020-08-02 ENCOUNTER — Other Ambulatory Visit: Payer: Self-pay | Admitting: Family Medicine

## 2020-08-02 DIAGNOSIS — N6320 Unspecified lump in the left breast, unspecified quadrant: Secondary | ICD-10-CM

## 2020-09-16 ENCOUNTER — Other Ambulatory Visit: Payer: Self-pay

## 2020-09-16 ENCOUNTER — Ambulatory Visit
Admission: RE | Admit: 2020-09-16 | Discharge: 2020-09-16 | Disposition: A | Discharge status: Home / Self Care | Payer: BC Managed Care – PPO | Source: Ambulatory Visit | Attending: Family Medicine | Admitting: Family Medicine

## 2020-09-16 DIAGNOSIS — N6320 Unspecified lump in the left breast, unspecified quadrant: Secondary | ICD-10-CM

## 2020-11-09 ENCOUNTER — Ambulatory Visit (INDEPENDENT_AMBULATORY_CARE_PROVIDER_SITE_OTHER): Payer: BC Managed Care – PPO | Admitting: Family Medicine

## 2020-11-09 ENCOUNTER — Encounter: Payer: Self-pay | Admitting: Family Medicine

## 2020-11-09 ENCOUNTER — Other Ambulatory Visit: Payer: Self-pay

## 2020-11-09 ENCOUNTER — Other Ambulatory Visit (INDEPENDENT_AMBULATORY_CARE_PROVIDER_SITE_OTHER): Payer: BC Managed Care – PPO

## 2020-11-09 VITALS — BP 122/82 | HR 49 | Temp 98.3°F | Ht 64.25 in | Wt 136.6 lb

## 2020-11-09 DIAGNOSIS — R002 Palpitations: Secondary | ICD-10-CM | POA: Diagnosis not present

## 2020-11-09 DIAGNOSIS — M545 Low back pain, unspecified: Secondary | ICD-10-CM

## 2020-11-09 DIAGNOSIS — N926 Irregular menstruation, unspecified: Secondary | ICD-10-CM

## 2020-11-09 DIAGNOSIS — Z131 Encounter for screening for diabetes mellitus: Secondary | ICD-10-CM

## 2020-11-09 DIAGNOSIS — Z0000 Encounter for general adult medical examination without abnormal findings: Secondary | ICD-10-CM

## 2020-11-09 DIAGNOSIS — R739 Hyperglycemia, unspecified: Secondary | ICD-10-CM

## 2020-11-09 DIAGNOSIS — M419 Scoliosis, unspecified: Secondary | ICD-10-CM

## 2020-11-09 DIAGNOSIS — Z13220 Encounter for screening for lipoid disorders: Secondary | ICD-10-CM | POA: Diagnosis not present

## 2020-11-09 DIAGNOSIS — G43009 Migraine without aura, not intractable, without status migrainosus: Secondary | ICD-10-CM

## 2020-11-09 LAB — COMPREHENSIVE METABOLIC PANEL
ALT: 15 U/L (ref 0–35)
AST: 18 U/L (ref 0–37)
Albumin: 4.2 g/dL (ref 3.5–5.2)
Alkaline Phosphatase: 81 U/L (ref 39–117)
BUN: 12 mg/dL (ref 6–23)
CO2: 26 mEq/L (ref 19–32)
Calcium: 9.6 mg/dL (ref 8.4–10.5)
Chloride: 103 mEq/L (ref 96–112)
Creatinine, Ser: 0.84 mg/dL (ref 0.40–1.20)
GFR: 80.43 mL/min (ref >60.00)
Glucose, Bld: 111 mg/dL — ABNORMAL HIGH (ref 70–99)
Potassium: 3.8 mEq/L (ref 3.5–5.1)
Sodium: 138 mEq/L (ref 135–145)
Total Bilirubin: 0.5 mg/dL (ref 0.2–1.2)
Total Protein: 6.8 g/dL (ref 6.0–8.3)

## 2020-11-09 LAB — TSH: TSH: 2.47 u[IU]/mL (ref 0.35–5.50)

## 2020-11-09 LAB — CBC WITH DIFFERENTIAL/PLATELET
Basophils Absolute: 0.1 10*3/uL (ref 0.0–0.1)
Basophils Relative: 1 % (ref 0.0–3.0)
Eosinophils Absolute: 0.2 10*3/uL (ref 0.0–0.7)
Eosinophils Relative: 3.7 % (ref 0.0–5.0)
HCT: 41.2 % (ref 36.0–46.0)
Hemoglobin: 13.8 g/dL (ref 12.0–15.0)
Lymphocytes Relative: 21.4 % (ref 12.0–46.0)
Lymphs Abs: 1.2 10*3/uL (ref 0.7–4.0)
MCHC: 33.4 g/dL (ref 30.0–36.0)
MCV: 92.1 fl (ref 78.0–100.0)
Monocytes Absolute: 0.5 10*3/uL (ref 0.1–1.0)
Monocytes Relative: 9 % (ref 3.0–12.0)
Neutro Abs: 3.5 10*3/uL (ref 1.4–7.7)
Neutrophils Relative %: 64.9 % (ref 43.0–77.0)
Platelets: 209 10*3/uL (ref 150.0–400.0)
RBC: 4.48 Mil/uL (ref 3.87–5.11)
RDW: 12.9 % (ref 11.5–15.5)
WBC: 5.4 10*3/uL (ref 4.0–10.5)

## 2020-11-09 LAB — LIPID PANEL
Cholesterol: 217 mg/dL — ABNORMAL HIGH (ref 0–200)
HDL: 66.4 mg/dL (ref >39.00)
LDL Cholesterol: 133 mg/dL — ABNORMAL HIGH (ref 0–99)
NonHDL: 150.58
Total CHOL/HDL Ratio: 3
Triglycerides: 90 mg/dL (ref 0.0–149.0)
VLDL: 18 mg/dL (ref 0.0–40.0)

## 2020-11-09 LAB — T3, FREE: T3, Free: 3.7 pg/mL (ref 2.3–4.2)

## 2020-11-09 LAB — T4, FREE: Free T4: 0.61 ng/dL (ref 0.60–1.60)

## 2020-11-09 NOTE — Addendum Note (Signed)
Addended by: Caren Macadam on: 11/09/2020 08:44 AM   Modules accepted: Orders

## 2020-11-09 NOTE — Progress Notes (Addendum)
KAMAIYA ANTILLA DOB: 16-Sep-1970 Encounter date: 11/09/2020  This is a 50 y.o. female who presents for complete physical   History of present illness/Additional concerns:  Palpitations: Metoprolol 25 mg daily Completed colonoscopy since last visit.  Repeat suggested in May 2029. Mammogram completed in August of this year.  Benign findings.  Menses: last year had a few months in the spring where she didn't have period and had some hot flashes. Restarted in June - skipped may, June, July, august, September, but came back last week. Normal in terms of flow and duration. Not getting hot flashes currently. No vaginal dryness, discomfort.   Getting back into school year and getting up at 5:20 makes her ready for bed in evening if she sits down. If she is busy she will stay awake. Sleeps well once she lays down.   Sees dentist regularly.   Thinks she had covid in June - her test was negative but daughter was positive and they were sick with all same sx at same time.    Past Medical History:  Diagnosis Date   Chicken pox    Headache(784.0)    frequent   IBS (irritable bowel syndrome)    Infertility, female    Murmur    Palpitations    on meds   Scoliosis    Past Surgical History:  Procedure Laterality Date   IVF treatment     WISDOM TOOTH EXTRACTION     WISDOM TOOTH EXTRACTION     Allergies  Allergen Reactions   Sulfa Antibiotics Hives    Childhood allergy    Current Meds  Medication Sig   Acetaminophen (TYLENOL PO) Take by mouth as needed.   ibuprofen (ADVIL,MOTRIN) 200 MG tablet Take 200 mg by mouth every 6 (six) hours as needed for moderate pain.   metoprolol succinate (TOPROL-XL) 25 MG 24 hr tablet Take 1 tablet (25 mg total) by mouth daily.   Social History   Tobacco Use   Smoking status: Never   Smokeless tobacco: Never  Substance Use Topics   Alcohol use: No   Family History  Problem Relation Age of Onset   Asthma Mother    Healthy Father    Colon cancer  Paternal Grandfather    Colon polyps Paternal Grandfather 40   Lung cancer Maternal Grandfather    Allergies Sister    Asthma Sister    Healthy Brother    Dementia Paternal Grandmother    Esophageal cancer Neg Hx    Rectal cancer Neg Hx    Stomach cancer Neg Hx      Review of Systems  Constitutional:  Negative for activity change, appetite change, chills, fatigue, fever and unexpected weight change.  HENT:  Negative for congestion, ear pain, hearing loss, sinus pressure, sinus pain, sore throat and trouble swallowing.   Eyes:  Negative for pain and visual disturbance.  Respiratory:  Negative for cough, chest tightness, shortness of breath and wheezing.   Cardiovascular:  Negative for chest pain, palpitations and leg swelling.  Gastrointestinal:  Negative for abdominal pain, blood in stool, constipation, diarrhea, nausea and vomiting.  Genitourinary:  Negative for difficulty urinating and menstrual problem.  Musculoskeletal:  Positive for back pain (lower back). Negative for arthralgias.  Skin:  Negative for rash.  Neurological:  Negative for dizziness, weakness, numbness and headaches.       Gets migraines about twice monthly - right sided, lasts for 3 days when she gets it; motrin doesn't help. Some right sided neck pain up  and around to front of head.   Hematological:  Negative for adenopathy. Does not bruise/bleed easily.  Psychiatric/Behavioral:  Negative for sleep disturbance and suicidal ideas. The patient is not nervous/anxious.    CBC:  Lab Results  Component Value Date   WBC 5.2 09/23/2018   HGB 13.5 09/23/2018   HCT 40.4 09/23/2018   MCHC 33.4 09/23/2018   RDW 13.3 09/23/2018   PLT 195.0 09/23/2018   CMP: Lab Results  Component Value Date   NA 139 09/23/2018   NA 141 07/29/2017   K 3.9 09/23/2018   CL 102 09/23/2018   CO2 28 09/23/2018   GLUCOSE 110 (H) 09/23/2018   BUN 12 09/23/2018   BUN 6 07/29/2017   CREATININE 0.96 09/23/2018   GFRAA 101 07/29/2017    CALCIUM 9.6 09/23/2018   PROT 6.6 09/23/2018   BILITOT 0.6 09/23/2018   ALKPHOS 79 09/23/2018   ALT 11 09/23/2018   AST 12 09/23/2018   LIPID: Lab Results  Component Value Date   CHOL 175 03/09/2019   TRIG 83.0 03/09/2019   HDL 46.90 03/09/2019   LDLCALC 111 (H) 03/09/2019    Objective:  BP 130/80 (BP Location: Left Arm, Patient Position: Sitting, Cuff Size: Normal)   Pulse (!) 49   Temp 98.3 F (36.8 C) (Oral)   Ht 5' 4.25" (1.632 m)   Wt 136 lb 9.6 oz (62 kg)   LMP 11/01/2020 (Exact Date)   SpO2 98%   BMI 23.27 kg/m   Weight: 136 lb 9.6 oz (62 kg)   BP Readings from Last 3 Encounters:  11/09/20 130/80  05/17/20 96/66  12/10/19 120/73   Wt Readings from Last 3 Encounters:  11/09/20 136 lb 9.6 oz (62 kg)  05/17/20 128 lb (58.1 kg)  05/05/20 128 lb (58.1 kg)    Physical Exam Constitutional:      General: She is not in acute distress.    Appearance: She is well-developed.  HENT:     Head: Normocephalic and atraumatic.     Right Ear: External ear normal.     Left Ear: External ear normal.     Mouth/Throat:     Pharynx: No oropharyngeal exudate.  Eyes:     Conjunctiva/sclera: Conjunctivae normal.     Pupils: Pupils are equal, round, and reactive to light.  Neck:     Thyroid: No thyromegaly.  Cardiovascular:     Rate and Rhythm: Normal rate and regular rhythm.     Heart sounds: Normal heart sounds. No murmur heard.   No friction rub. No gallop.  Pulmonary:     Effort: Pulmonary effort is normal.     Breath sounds: Normal breath sounds.  Abdominal:     General: Bowel sounds are normal. There is no distension.     Palpations: Abdomen is soft. There is no mass.     Tenderness: There is no abdominal tenderness. There is no guarding.     Hernia: No hernia is present.  Musculoskeletal:        General: No tenderness or deformity. Normal range of motion.     Cervical back: Normal range of motion and neck supple.     Comments: Scoliosis thoracic spine   Lymphadenopathy:     Cervical: No cervical adenopathy.  Skin:    General: Skin is warm and dry.     Findings: No rash.  Neurological:     Mental Status: She is alert and oriented to person, place, and time.     Deep  Tendon Reflexes: Reflexes normal.     Reflex Scores:      Tricep reflexes are 2+ on the right side and 2+ on the left side.      Bicep reflexes are 2+ on the right side and 2+ on the left side.      Brachioradialis reflexes are 2+ on the right side and 2+ on the left side.      Patellar reflexes are 2+ on the right side and 2+ on the left side. Psychiatric:        Speech: Speech normal.        Behavior: Behavior normal.        Thought Content: Thought content normal.    Assessment/Plan: There are no preventive care reminders to display for this patient. Health Maintenance reviewed - up to date.  1. Preventative health care She is going to work on regular walking; keep up with healthy eating. Encourage her to get flu/covid shot this fall.  2. Migraine without aura and without status migrainosus, not intractable I am going to see if I can get her a sample of nurtec to try. We discussed triptans but worry about palpitations with this or nausea, which she does not have with migraines.   3. Palpitations: heart rate controlled, continues with metoprolol.   Consider sports med referral if back bothering her more.   Return in about 1 year (around 11/09/2021) for physical exam.  Micheline Rough, MD

## 2020-11-09 NOTE — Addendum Note (Signed)
Addended by: Azzie Almas on: 11/09/2020 05:22 PM   Modules accepted: Orders

## 2020-11-09 NOTE — Addendum Note (Signed)
Addended by: Amanda Cockayne on: 11/09/2020 08:49 AM   Modules accepted: Orders

## 2020-11-10 LAB — HEMOGLOBIN A1C: Hgb A1c MFr Bld: 5.5 % (ref 4.6–6.5)

## 2020-11-11 ENCOUNTER — Other Ambulatory Visit: Payer: Self-pay | Admitting: Cardiology

## 2020-11-11 DIAGNOSIS — R002 Palpitations: Secondary | ICD-10-CM

## 2020-11-28 NOTE — Progress Notes (Signed)
HPI: FU palpitations. Echocardiogram July 2019 showed normal LV function, mild mitral regurgitation.  Monitor July 2019 showed sinus rhythm with rare PAC and PVC.  Since I last saw her, there is no dyspnea, chest pain or syncope.  Her palpitations are occasionally increased and described as a skip.  Current Outpatient Medications  Medication Sig Dispense Refill   Acetaminophen (TYLENOL PO) Take by mouth as needed.     ibuprofen (ADVIL,MOTRIN) 200 MG tablet Take 200 mg by mouth every 6 (six) hours as needed for moderate pain.     metoprolol succinate (TOPROL-XL) 25 MG 24 hr tablet TAKE 1 TABLET BY MOUTH EVERY DAY 90 tablet 3   No current facility-administered medications for this visit.     Past Medical History:  Diagnosis Date   Chicken pox    Headache(784.0)    frequent   IBS (irritable bowel syndrome)    Infertility, female    Murmur    Palpitations    on meds   Scoliosis     Past Surgical History:  Procedure Laterality Date   IVF treatment     WISDOM TOOTH EXTRACTION     WISDOM TOOTH EXTRACTION      Social History   Socioeconomic History   Marital status: Married    Spouse name: Not on file   Number of children: 2   Years of education: Not on file   Highest education level: Not on file  Occupational History    Comment: First grade teacher  Tobacco Use   Smoking status: Never   Smokeless tobacco: Never  Vaping Use   Vaping Use: Never used  Substance and Sexual Activity   Alcohol use: No   Drug use: No   Sexual activity: Yes  Other Topics Concern   Not on file  Social History Narrative          Work or School: [employer]              Home Situation: has two adopted children             Spiritual Beliefs: methodist              Lifestyle: no regular exercise, heathy diet               Social Determinants of Health   Financial Resource Strain: Not on file  Food Insecurity: Not on file  Transportation Needs: Not on file  Physical  Activity: Not on file  Stress: Not on file  Social Connections: Not on file  Intimate Partner Violence: Not on file    Family History  Problem Relation Age of Onset   Asthma Mother    Healthy Father    Colon cancer Paternal Grandfather    Colon polyps Paternal Grandfather 2   Lung cancer Maternal Grandfather    Allergies Sister    Asthma Sister    Healthy Brother    Dementia Paternal Grandmother    Esophageal cancer Neg Hx    Rectal cancer Neg Hx    Stomach cancer Neg Hx     ROS: no fevers or chills, productive cough, hemoptysis, dysphasia, odynophagia, melena, hematochezia, dysuria, hematuria, rash, seizure activity, orthopnea, PND, pedal edema, claudication. Remaining systems are negative.  Physical Exam: Well-developed well-nourished in no acute distress.  Skin is warm and dry.  HEENT is normal.  Neck is supple.  Chest is clear to auscultation with normal expansion.  Cardiovascular exam is regular rate and rhythm.  Abdominal exam nontender or distended. No masses palpated. Extremities show no edema. neuro grossly intact  ECG-normal sinus rhythm at a rate of 69, nonspecific ST changes.  Personally reviewed  A/P  1 palpitations-felt secondary to PACs and PVCs.  Symptoms somewhat worse.  Increase Toprol to 50 mg daily.  2 hyperlipidemia-no history of vascular disease.  Plan to continue diet.  Kirk Ruths, MD

## 2020-12-01 NOTE — Progress Notes (Signed)
[place] Phone: [phone] Subjective:   Debra Johnson, am serving as a scribe for Dr. Hulan Saas.  This visit occurred during the SARS-CoV-2 public health emergency.  Safety protocols were in place, including screening questions prior to the visit, additional usage of staff PPE, and extensive cleaning of exam room while observing appropriate contact time as indicated for disinfecting solutions.   I'm seeing this patient by the request  of:  Koberlein, Steele Berg, MD  CC: headaches   [id number]  Debra Johnson is a 50 y.o. female coming in with complaint of chronic headaches that will last for 3 days in duration.  Denies any nausea or photophobia but will have visual changes prior to the headaches. Has been told they are migraines and most often on the R side. Patient was given Imitrex and did not take medication.   Patient having chronic neck and lumbar spine pain as well. Patient has tried heat, rest and Advil for pain. Denies any radiating symptoms in LE. Will sometimes have an arm that goes asleep at night and she is not lying on that arm.     Past Medical History:  Diagnosis Date   Chicken pox    Headache(784.0)    frequent   IBS (irritable bowel syndrome)    Infertility, female    Murmur    Palpitations    on meds   Scoliosis    Past Surgical History:  Procedure Laterality Date   IVF treatment     WISDOM TOOTH EXTRACTION     WISDOM TOOTH EXTRACTION     Social History   Socioeconomic History   Marital status: Married    Spouse name: Not on file   Number of children: 2   Years of education: Not on file   Highest education level: Not on file  Occupational History    Comment: First grade teacher  Tobacco Use   Smoking status: Never   Smokeless tobacco: Never  Vaping Use   Vaping Use: Never used  Substance and Sexual Activity   Alcohol use: Johnson   Drug use: Johnson   Sexual activity: Yes   Other Topics Concern   Not on file  Social History Narrative          Work or School: [employer]              Home Situation: has two adopted children             Spiritual Beliefs: methodist              Lifestyle: Johnson regular exercise, heathy diet               Social Determinants of Health   Financial Resource Strain: Not on file  Food Insecurity: Not on file  Transportation Needs: Not on file  Physical Activity: Not on file  Stress: Not on file  Social Connections: Not on file   Allergies  Allergen Reactions   Sulfa Antibiotics Hives    Childhood allergy    Family History  Problem Relation Age of Onset   Asthma Mother    Healthy Father    Colon cancer Paternal Grandfather    Colon polyps Paternal Grandfather 76   Lung cancer Maternal Grandfather    Allergies Sister    Asthma Sister    Healthy Brother    Dementia Paternal Grandmother    Esophageal cancer Neg Hx  Rectal cancer Neg Hx    Stomach cancer Neg Hx      Current Outpatient Medications (Cardiovascular):    metoprolol succinate (TOPROL-XL) 25 MG 24 hr tablet, TAKE 1 TABLET BY MOUTH EVERY DAY   Current Outpatient Medications (Analgesics):    Acetaminophen (TYLENOL PO), Take by mouth as needed.   ibuprofen (ADVIL,MOTRIN) 200 MG tablet, Take 200 mg by mouth every 6 (six) hours as needed for moderate pain.    Reviewed prior external information including notes and imaging from  primary care provider As well as notes that were available from care everywhere and other healthcare systems.  Past medical history, social, surgical and family history all reviewed in electronic medical record.  Johnson pertanent information unless stated regarding to the chief complaint.   Review of Systems:  Johnson headache, visual changes, nausea, vomiting, diarrhea, constipation, dizziness, abdominal pain, skin rash, fevers, chills, night sweats, weight loss, swollen lymph nodes, body aches, joint swelling, chest  pain, shortness of breath, mood changes. POSITIVE muscle aches  Objective  Blood pressure 128/82, pulse 76, height 5\' 4"  (1.626 m), weight 136 lb (61.7 kg), SpO2 98 %.   General: Johnson apparent distress alert and oriented x3 mood and affect normal, dressed appropriately.  HEENT: Pupils equal, extraocular movements intact  Respiratory: Patient's speak in full sentences and does not appear short of breath  Cardiovascular: Johnson lower extremity edema, non tender, Johnson erythema  Gait normal with good balance and coordination.  MSK: Patient does have significant scoliosis noted with a left-sided sidebending and right rotation of the thoracic spine and a right-sided sidebending secondary curvature of the lumbar spine.  Mild tenderness to palpation mostly in the right parascapular region.  Neck exam does have some mild loss of lordosis.  Patient does have some tightness noted with some mild scapular dyskinesis noted of the thoracic spine.  Negative straight leg test.  [id number]; 15 additional minutes spent for Therapeutic exercises as stated in above notes.  This included exercises focusing on stretching, strengthening, with significant focus on eccentric aspects.   Long term goals include an improvement in range of motion, strength, endurance as well as avoiding reinjury. Patient's frequency would include in 1-2 times a day, 3-5 times a week for a duration of 6-12 weeks.  xercises that included:  Basic scapular stabilization to include adduction and depression of scapula Scaption, focusing on proper movement and good control Internal and External rotation utilizing a theraband, with elbow tucked at side entire time Rows with theraband  Proper technique shown and discussed handout in great detail with ATC.  All questions were discussed and answered.      Impression and Recommendations:     The above documentation has been reviewed and is accurate and complete Lyndal Pulley, DO

## 2020-12-05 ENCOUNTER — Encounter: Payer: Self-pay | Admitting: Family Medicine

## 2020-12-05 ENCOUNTER — Other Ambulatory Visit: Payer: Self-pay

## 2020-12-05 ENCOUNTER — Ambulatory Visit: Payer: BC Managed Care – PPO | Admitting: Family Medicine

## 2020-12-05 ENCOUNTER — Ambulatory Visit (INDEPENDENT_AMBULATORY_CARE_PROVIDER_SITE_OTHER): Payer: BC Managed Care – PPO

## 2020-12-05 VITALS — BP 128/82 | HR 76 | Ht 64.0 in | Wt 136.0 lb

## 2020-12-05 DIAGNOSIS — M542 Cervicalgia: Secondary | ICD-10-CM

## 2020-12-05 DIAGNOSIS — M4120 Other idiopathic scoliosis, site unspecified: Secondary | ICD-10-CM | POA: Insufficient documentation

## 2020-12-05 DIAGNOSIS — M546 Pain in thoracic spine: Secondary | ICD-10-CM | POA: Diagnosis not present

## 2020-12-05 DIAGNOSIS — M4125 Other idiopathic scoliosis, thoracolumbar region: Secondary | ICD-10-CM

## 2020-12-05 DIAGNOSIS — M545 Low back pain, unspecified: Secondary | ICD-10-CM | POA: Diagnosis not present

## 2020-12-05 DIAGNOSIS — G2589 Other specified extrapyramidal and movement disorders: Secondary | ICD-10-CM

## 2020-12-05 DIAGNOSIS — G43809 Other migraine, not intractable, without status migrainosus: Secondary | ICD-10-CM

## 2020-12-05 NOTE — Assessment & Plan Note (Signed)
Do believe some of that is secondary to posture and stress related.  We discussed over-the-counter medications that could be beneficial.  Patient does have a strong family history of migraines in her father.  Discussed which activities to do which wants to avoid.  Follow-up again 6 to 8 weeks.

## 2020-12-05 NOTE — Assessment & Plan Note (Signed)
Discussed with patient about icing regimen and home exercises.  Discussed which activities to do which wants to avoid.  Increase activity slowly.  Patient work with Product/process development scientist.  Follow-up again in 6 to 8 weeks.

## 2020-12-05 NOTE — Assessment & Plan Note (Signed)
Thoracolumbar scoliosis.  Could be contributing some with back pain as well.  I do think there is a chance for some fatigue.  Discussed icing regimen and home exercises, discussed which activities to do which wants to avoid.  Increase activity slowly.  Follow-up again in 6 to 8 weeks.  At that time could consider the possibility of osteopathic manipulation.  We will have patient start with the home exercises after working with athletic trainer today.

## 2020-12-05 NOTE — Patient Instructions (Addendum)
CoQ10 200mg   Vit D 2000IU daily Scapular and LBP exercises Xray on way out  See me in 5-6 weeks

## 2020-12-06 ENCOUNTER — Encounter: Payer: Self-pay | Admitting: Cardiology

## 2020-12-06 ENCOUNTER — Ambulatory Visit: Payer: BC Managed Care – PPO | Admitting: Cardiology

## 2020-12-06 VITALS — BP 130/77 | HR 69 | Ht 64.0 in | Wt 136.4 lb

## 2020-12-06 DIAGNOSIS — E7800 Pure hypercholesterolemia, unspecified: Secondary | ICD-10-CM

## 2020-12-06 DIAGNOSIS — R002 Palpitations: Secondary | ICD-10-CM

## 2020-12-06 MED ORDER — METOPROLOL SUCCINATE ER 50 MG PO TB24: 50.0000 mg | ORAL_TABLET | Freq: Every day | ORAL | 3 refills | Status: DC

## 2020-12-06 NOTE — Patient Instructions (Signed)
Medication Instructions:   INCREASE METOPROLOL TO 50 MG ONCE DAILY= 2 OF THE 25 MG TABLETS TWICE DAILY  *If you need a refill on your cardiac medications before your next appointment, please call your pharmacy*   Follow-Up: At [facility], you and your health needs are our priority.  As part of our continuing mission to provide you with exceptional heart care, we have created designated Provider Care Teams.  These Care Teams include your primary Cardiologist (physician) and Advanced Practice Providers (APPs -  Physician Assistants and Nurse Practitioners) who all work together to provide you with the care you need, when you need it.  We recommend signing up for the patient portal called "MyChart".  Sign up information is provided on this After Visit Summary.  MyChart is used to connect with patients for Virtual Visits (Telemedicine).  Patients are able to view lab/test results, encounter notes, upcoming appointments, etc.  Non-urgent messages can be sent to your provider as well.   To learn more about what you can do with MyChart, go to [email or web address].    Your next appointment:   12 month(s)  The format for your next appointment:   In Person  Provider:   Kirk Ruths MD

## 2020-12-09 ENCOUNTER — Ambulatory Visit (INDEPENDENT_AMBULATORY_CARE_PROVIDER_SITE_OTHER): Payer: BC Managed Care – PPO

## 2020-12-09 DIAGNOSIS — Z23 Encounter for immunization: Secondary | ICD-10-CM | POA: Diagnosis not present

## 2021-01-10 ENCOUNTER — Telehealth: Payer: Self-pay

## 2021-01-10 ENCOUNTER — Encounter: Payer: Self-pay | Admitting: Cardiology

## 2021-01-10 NOTE — Telephone Encounter (Signed)
Caller states, has a mild case of the flu. She's a Pharmacist, hospital. Has mild diarrhea. Not eating much. This morning palpitations, drank apple juice it helped. What can she take or drink for the diarrhea. --Has been having diarrhea. Was having palpitations this morning. Still beating faster at times.  01/10/2021 9:31:32 AM [place], RN, [place] Advice Given Per Guideline HOME CARE: * You should be able to treat this at home. REASSURANCE AND EDUCATION - EXTRA HEARTBEATS AND PALPITATIONS: * Occasional extra heartbeats are experienced by most everyone. Lack of sleep, stress, and caffeinated beverages can aggravate this condition. CALL BACK IF: * Chest pain, lightheadedness or difficulty breathing occurs * Heart beating over 140 beats / minute * More than 3 extra or skipped beats / minute * You become worse CARE ADVICE given per Heart Rate and Heartbeat Questions (Adult) guideline.

## 2021-01-16 NOTE — Progress Notes (Deleted)
°  [place] Phone: [phone] Subjective:    I'm seeing this patient by the request  of:  Debra Macadam, MD  CC:   [id number]  Debra Johnson is a 50 y.o. female coming in with complaint of back and neck pain. OMT 12/05/2020. Patient states   Medications patient has been prescribed: None  Taking:         Reviewed prior external information including notes and imaging from previsou exam, outside providers and external EMR if available.   As well as notes that were available from care everywhere and other healthcare systems.  Past medical history, social, surgical and family history all reviewed in electronic medical record.  No pertanent information unless stated regarding to the chief complaint.   Past Medical History:  Diagnosis Date   Chicken pox    Headache(784.0)    frequent   IBS (irritable bowel syndrome)    Infertility, female    Murmur    Palpitations    on meds   Scoliosis     Allergies  Allergen Reactions   Sulfa Antibiotics Hives    Childhood allergy      Review of Systems:  No headache, visual changes, nausea, vomiting, diarrhea, constipation, dizziness, abdominal pain, skin rash, fevers, chills, night sweats, weight loss, swollen lymph nodes, body aches, joint swelling, chest pain, shortness of breath, mood changes. POSITIVE muscle aches  Objective  There were no vitals taken for this visit.   General: No apparent distress alert and oriented x3 mood and affect normal, dressed appropriately.  HEENT: Pupils equal, extraocular movements intact  Respiratory: Patient's speak in full sentences and does not appear short of breath  Cardiovascular: No lower extremity edema, non tender, no erythema  Neuro: Cranial nerves II through XII are intact, neurovascularly intact in all extremities with 2+ DTRs and 2+ pulses.  Gait normal with good balance and coordination.  MSK:  Non  tender with full range of motion and good stability and symmetric strength and tone of shoulders, elbows, wrist, hip, knee and ankles bilaterally.  Back - Normal skin, Spine with normal alignment and no deformity.  No tenderness to vertebral process palpation.  Paraspinous muscles are not tender and without spasm.   Range of motion is full at neck and lumbar sacral regions  Osteopathic findings  C2 flexed rotated and side bent right C6 flexed rotated and side bent left T3 extended rotated and side bent right inhaled rib T9 extended rotated and side bent left L2 flexed rotated and side bent right Sacrum right on right       Assessment and Plan:    Nonallopathic problems  Decision today to treat with OMT was based on Physical Exam  After verbal consent patient was treated with HVLA, ME, FPR techniques in cervical, rib, thoracic, lumbar, and sacral  areas  Patient tolerated the procedure well with improvement in symptoms  Patient given exercises, stretches and lifestyle modifications  See medications in patient instructions if given  Patient will follow up in 4-8 weeks      The above documentation has been reviewed and is accurate and complete Debra Johnson       Note: This dictation was prepared with Dragon dictation along with smaller phrase technology. Any transcriptional errors that result from this process are unintentional.

## 2021-01-17 ENCOUNTER — Ambulatory Visit: Payer: BC Managed Care – PPO | Admitting: Family Medicine

## 2021-01-18 ENCOUNTER — Ambulatory Visit: Payer: BC Managed Care – PPO | Admitting: Family Medicine

## 2021-01-18 ENCOUNTER — Telehealth: Payer: Self-pay | Admitting: Family Medicine

## 2021-01-18 VITALS — BP 120/70 | HR 79 | Temp 97.7°F | Wt 137.0 lb

## 2021-01-18 DIAGNOSIS — J020 Streptococcal pharyngitis: Secondary | ICD-10-CM | POA: Diagnosis not present

## 2021-01-18 DIAGNOSIS — J029 Acute pharyngitis, unspecified: Secondary | ICD-10-CM

## 2021-01-18 LAB — POCT RAPID STREP A (OFFICE): Rapid Strep A Screen: POSITIVE — AB

## 2021-01-18 LAB — POC COVID19 BINAXNOW: SARS Coronavirus 2 Ag: NEGATIVE

## 2021-01-18 LAB — POC INFLUENZA A&B (BINAX/QUICKVUE)
Influenza A, POC: NEGATIVE
Influenza B, POC: NEGATIVE

## 2021-01-18 MED ORDER — AMOXICILLIN 250 MG/5ML PO SUSR: 500.0000 mg | Freq: Three times a day (TID) | ORAL | 0 refills | Status: DC

## 2021-01-18 NOTE — Progress Notes (Signed)
[place] [address] [place] Phone: [phone] Subjective:   IVilma Meckel, am serving as a scribe for Dr. Hulan Saas. This visit occurred during the SARS-CoV-2 public health emergency.  Safety protocols were in place, including screening questions prior to the visit, additional usage of staff PPE, and extensive cleaning of exam room while observing appropriate contact time as indicated for disinfecting solutions.   I'm seeing this patient by the request  of:  Caren Macadam, MD  CC: Neck and back pain follow-up  [id number]  12/05/2020 Discussed with patient about icing regimen and home exercises.  Discussed which activities to do which wants to avoid.  Increase activity slowly.  Patient work with Product/process development scientist.  Follow-up again in 6 to 8 weeks.  Do believe some of that is secondary to posture and stress related.  We discussed over-the-counter medications that could be beneficial.  Patient does have a strong family history of migraines in her father.  Discussed which activities to do which wants to avoid.  Follow-up again 6 to 8 weeks.  Thoracolumbar scoliosis.  Could be contributing some with back pain as well.  I do think there is a chance for some fatigue.  Discussed icing regimen and home exercises, discussed which activities to do which wants to avoid.  Increase activity slowly.  Follow-up again in 6 to 8 weeks.  At that time could consider the possibility of osteopathic manipulation.  We will have patient start with the home exercises after working with athletic trainer today.  Updated 01/19/2021 DYESHA HENAULT is a 50 y.o. female coming in with complaint of back and neck pain. Doing better. Pain less intense and less often. SI on the right side is where she has problems in the morning. No new complaints. Two days after she saw [place] was in accident, but doesn't seem to be causing issues.  Xray IMPRESSION: No recent  fracture is seen. Cervical spondylosis at C5-C6 and C6-C7 levels with possible encroachment of neural foramina.      Past Medical History:  Diagnosis Date   Chicken pox    Headache(784.0)    frequent   IBS (irritable bowel syndrome)    Infertility, female    Murmur    Palpitations    on meds   Scoliosis    Past Surgical History:  Procedure Laterality Date   IVF treatment     WISDOM TOOTH EXTRACTION     WISDOM TOOTH EXTRACTION     Social History   Socioeconomic History   Marital status: Married    Spouse name: Not on file   Number of children: 2   Years of education: Not on file   Highest education level: Not on file  Occupational History    Comment: First grade teacher  Tobacco Use   Smoking status: Never   Smokeless tobacco: Never  Vaping Use   Vaping Use: Never used  Substance and Sexual Activity   Alcohol use: No   Drug use: No   Sexual activity: Yes  Other Topics Concern   Not on file  Social History Narrative          Work or School: [employer]              Home Situation: has two adopted children             Spiritual Beliefs: methodist  Lifestyle: no regular exercise, heathy diet               Social Determinants of Health   Financial Resource Strain: Not on file  Food Insecurity: Not on file  Transportation Needs: Not on file  Physical Activity: Not on file  Stress: Not on file  Social Connections: Not on file   Allergies  Allergen Reactions   Sulfa Antibiotics Hives    Childhood allergy    Family History  Problem Relation Age of Onset   Asthma Mother    Healthy Father    Colon cancer Paternal Grandfather    Colon polyps Paternal Grandfather 10   Lung cancer Maternal Grandfather    Allergies Sister    Asthma Sister    Healthy Brother    Dementia Paternal Grandmother    Esophageal cancer Neg Hx    Rectal cancer Neg Hx    Stomach cancer Neg Hx      Current Outpatient Medications (Cardiovascular):     metoprolol succinate (TOPROL-XL) 50 MG 24 hr tablet, Take 1 tablet (50 mg total) by mouth daily.   Current Outpatient Medications (Analgesics):    Acetaminophen (TYLENOL PO), Take by mouth as needed.   ibuprofen (ADVIL,MOTRIN) 200 MG tablet, Take 200 mg by mouth every 6 (six) hours as needed for moderate pain.   Current Outpatient Medications (Other):    amoxicillin (AMOXIL) 250 MG/5ML suspension, Take 10 mLs (500 mg total) by mouth 3 (three) times daily.    Reviewed prior external information including notes and imaging from  primary care provider As well as notes that were available from care everywhere and other healthcare systems.  Past medical history, social, surgical and family history all reviewed in electronic medical record.  No pertanent information unless stated regarding to the chief complaint.   Review of Systems:  No headache, visual changes, nausea, vomiting, diarrhea, constipation, dizziness, abdominal pain, skin rash, fevers, chills, night sweats, weight loss, swollen lymph nodes, body aches, joint swelling, chest pain, shortness of breath, mood changes. POSITIVE muscle aches  Objective  Blood pressure 118/76, pulse 80, height 5\' 4"  (1.626 m), weight 136 lb (61.7 kg), SpO2 99 %.   General: No apparent distress alert and oriented x3 mood and affect normal, dressed appropriately.  HEENT: Pupils equal, extraocular movements intact  Respiratory: Patient's speak in full sentences and does not appear short of breath  Cardiovascular: No lower extremity edema, non tender, no erythema  Gait normal with good balance and coordination.  MSK: Scoliosis noted.  Patient does have tightness noted in the upper paraspinal musculature of the thoracic spine.  Patient does have the scoliosis noted.  Osteopathic findings C4 flexed rotated and side bent T3-T7 neutral rotated right side right L1-L2 3 neutral rotated left side bent right Sacrum right on right   Impression and  Recommendations:     The above documentation has been reviewed and is accurate and complete Lyndal Pulley, DO

## 2021-01-18 NOTE — Progress Notes (Signed)
Established Patient Office Visit  Subjective:  Patient ID: Debra Johnson, female    DOB: 12/30/1970  Age: 50 y.o. MRN: [id number]  CC:  Chief Complaint  Patient presents with   Sore Throat    HPI Debra Johnson presents for chief complaint of sore throat.  She actually had what sounds like viral URI symptoms back in early December.  She was improving and then last Thursday developed sore throat.  No fever.  Noticed some erythema in her posterior pharynx.  She has had strep pharyngitis previously.  She has had persistent sore throat which prompted her visit.  Minimal postnasal drip.  No cough.  No rash.  No nausea or vomiting.  Past Medical History:  Diagnosis Date   Chicken pox    Headache(784.0)    frequent   IBS (irritable bowel syndrome)    Infertility, female    Murmur    Palpitations    on meds   Scoliosis     Past Surgical History:  Procedure Laterality Date   IVF treatment     WISDOM TOOTH EXTRACTION     WISDOM TOOTH EXTRACTION      Family History  Problem Relation Age of Onset   Asthma Mother    Healthy Father    Colon cancer Paternal Grandfather    Colon polyps Paternal Grandfather 35   Lung cancer Maternal Grandfather    Allergies Sister    Asthma Sister    Healthy Brother    Dementia Paternal Grandmother    Esophageal cancer Neg Hx    Rectal cancer Neg Hx    Stomach cancer Neg Hx     Social History   Socioeconomic History   Marital status: Married    Spouse name: Not on file   Number of children: 2   Years of education: Not on file   Highest education level: Not on file  Occupational History    Comment: First grade teacher  Tobacco Use   Smoking status: Never   Smokeless tobacco: Never  Vaping Use   Vaping Use: Never used  Substance and Sexual Activity   Alcohol use: No   Drug use: No   Sexual activity: Yes  Other Topics Concern   Not on file  Social History Narrative          Work or School: [employer]               Home Situation: has two adopted children             Spiritual Beliefs: methodist              Lifestyle: no regular exercise, heathy diet               Social Determinants of Health   Financial Resource Strain: Not on file  Food Insecurity: Not on file  Transportation Needs: Not on file  Physical Activity: Not on file  Stress: Not on file  Social Connections: Not on file  Intimate Partner Violence: Not on file    Outpatient Medications Prior to Visit  Medication Sig Dispense Refill   Acetaminophen (TYLENOL PO) Take by mouth as needed.     ibuprofen (ADVIL,MOTRIN) 200 MG tablet Take 200 mg by mouth every 6 (six) hours as needed for moderate pain.     metoprolol succinate (TOPROL-XL) 50 MG 24 hr tablet Take 1 tablet (50 mg total) by mouth daily. 90 tablet 3   No facility-administered medications prior to visit.  Allergies  Allergen Reactions   Sulfa Antibiotics Hives    Childhood allergy     ROS Review of Systems  Constitutional:  Negative for chills and fever.  HENT:  Positive for sore throat.   Respiratory:  Negative for cough.   Skin:  Negative for rash.     Objective:    Physical Exam Vitals reviewed.  Constitutional:      Appearance: She is well-developed.  HENT:     Mouth/Throat:     Comments: She has erythema posterior pharynx bilaterally.  No peritonsillar abscess.  No visible exudate. Neck:     Comments: Does have some mild anterior cervical adenopathy bilaterally Cardiovascular:     Rate and Rhythm: Normal rate and regular rhythm.  Pulmonary:     Effort: Pulmonary effort is normal.     Breath sounds: Normal breath sounds.  Musculoskeletal:     Cervical back: Neck supple.  Neurological:     Mental Status: She is alert.    BP 120/70 (BP Location: Left Arm, Patient Position: Sitting, Cuff Size: Normal)    Pulse 79    Temp 97.7 F (36.5 C) (Oral)    Wt 137 lb (62.1 kg)    SpO2 98%    BMI 23.52 kg/m  Wt Readings from Last 3 Encounters:   01/18/21 137 lb (62.1 kg)  12/06/20 136 lb 6.4 oz (61.9 kg)  12/05/20 136 lb (61.7 kg)     Health Maintenance Due  Topic Date Due   COVID-19 Vaccine (3 - Booster for Pfizer series) 06/12/2019    There are no preventive care reminders to display for this patient.  Lab Results  Component Value Date   TSH 2.47 11/09/2020   Lab Results  Component Value Date   WBC 5.4 11/09/2020   HGB 13.8 11/09/2020   HCT 41.2 11/09/2020   MCV 92.1 11/09/2020   PLT 209.0 11/09/2020   Lab Results  Component Value Date   NA 138 11/09/2020   K 3.8 11/09/2020   CO2 26 11/09/2020   GLUCOSE 111 (H) 11/09/2020   BUN 12 11/09/2020   CREATININE 0.84 11/09/2020   BILITOT 0.5 11/09/2020   ALKPHOS 81 11/09/2020   AST 18 11/09/2020   ALT 15 11/09/2020   PROT 6.8 11/09/2020   ALBUMIN 4.2 11/09/2020   CALCIUM 9.6 11/09/2020   GFR 80.43 11/09/2020   Lab Results  Component Value Date   CHOL 217 (H) 11/09/2020   Lab Results  Component Value Date   HDL 66.40 11/09/2020   Lab Results  Component Value Date   LDLCALC 133 (H) 11/09/2020   Lab Results  Component Value Date   TRIG 90.0 11/09/2020   Lab Results  Component Value Date   CHOLHDL 3 11/09/2020   Lab Results  Component Value Date   HGBA1C 5.5 11/09/2020      Assessment & Plan:   Problem List Items Addressed This Visit   None Visit Diagnoses     Sore throat    -  Primary   Relevant Orders   POC Influenza A&B(BINAX/QUICKVUE) (Completed)   POC COVID-19 (Completed)   POCT rapid strep A (Completed)   Strep pharyngitis         Strep pharyngitis.  Her influenza and COVID screens were negative.  Positive strep screen.  She has difficulty swallowing tablets.  We wrote for amoxicillin 250 mg per 5 mL suspension take 2 teaspoons by mouth 3 times daily for 10 days Follow-up for any persistent or worsening symptoms  Meds ordered this encounter  Medications   amoxicillin (AMOXIL) 250 MG/5ML suspension    Sig: Take 10 mLs (500  mg total) by mouth 3 (three) times daily.    Dispense:  300 mL    Refill:  0    Follow-up: No follow-ups on file.    Carolann Littler, MD

## 2021-01-18 NOTE — Telephone Encounter (Signed)
Patient calling in with respiratory symptoms: Shortness of breath, chest pain, palpitations or other red words send to Triage  Does the patient have a fever over 100, cough, congestion, sore throat, runny nose, lost of taste/smell (please list symptoms that patient has)?lingering sore throat, slight runny nose  What date did symptoms start? Two weeks ago. Did have student that had strep and flu (If over 5 days ago, pt may be scheduled for in person visit)  Have you tested for Covid in the last 5 days? No  Did test negative over five days ago  If yes, was it positive OR negative ? If positive in the last 5 days, please schedule virtual visit now. If negative, schedule for an in person OV with the next available provider if PCP has no openings. Please also let patient know they will be tested again (follow the script below)  "you will have to arrive 90mins prior to your appt time to be Covid tested. Please park in back of office at the cone & call [phone] to let the staff know you have arrived. A staff member will meet you at your car to do a rapid covid test. Once the test has resulted you will be notified by phone of your results to determine if appt will remain an in person visit or be converted to a virtual/phone visit. If you arrive less than 76mins before your appt time, your visit will be automatically converted to virtual & any recommended testing will happen AFTER the visit."   [place]  If no availability for virtual visit in office,  please schedule another [place] office  If no availability at another [place] office, please instruct patient that they can schedule an evisit or virtual visit through their mychart account. Visits up to 8pm  patients can be seen in office 5 days after positive COVID test

## 2021-01-19 ENCOUNTER — Ambulatory Visit: Payer: BC Managed Care – PPO | Admitting: Family Medicine

## 2021-01-19 ENCOUNTER — Other Ambulatory Visit: Payer: Self-pay

## 2021-01-19 VITALS — BP 118/76 | HR 80 | Ht 64.0 in | Wt 136.0 lb

## 2021-01-19 DIAGNOSIS — M9901 Segmental and somatic dysfunction of cervical region: Secondary | ICD-10-CM

## 2021-01-19 DIAGNOSIS — M9902 Segmental and somatic dysfunction of thoracic region: Secondary | ICD-10-CM | POA: Diagnosis not present

## 2021-01-19 DIAGNOSIS — M9903 Segmental and somatic dysfunction of lumbar region: Secondary | ICD-10-CM | POA: Diagnosis not present

## 2021-01-19 DIAGNOSIS — M9908 Segmental and somatic dysfunction of rib cage: Secondary | ICD-10-CM

## 2021-01-19 DIAGNOSIS — M9904 Segmental and somatic dysfunction of sacral region: Secondary | ICD-10-CM

## 2021-01-19 DIAGNOSIS — G2589 Other specified extrapyramidal and movement disorders: Secondary | ICD-10-CM

## 2021-01-19 NOTE — Assessment & Plan Note (Signed)
Continued mild scapular dyskinesis.  Also has some sacroiliac dysfunction patient did respond extremely well to osteopathic manipulation today.  I do not have this would be a good option for treatment every 6 to 8 weeks.  Patient will follow up again in 6 weeks for further evaluation and treatment.

## 2021-01-19 NOTE — Patient Instructions (Signed)
Good to see you!  Tried manipulation today Continue vitamins See you again in 6 weeks

## 2021-01-19 NOTE — Assessment & Plan Note (Signed)

## 2021-02-16 NOTE — Progress Notes (Signed)
[facility] [place] Phone: [phone] Subjective:   Fontaine No, am serving as a scribe for Dr. Hulan Saas. This visit occurred during the SARS-CoV-2 public health emergency.  Safety protocols were in place, including screening questions prior to the visit, additional usage of staff PPE, and extensive cleaning of exam room while observing appropriate contact time as indicated for disinfecting solutions.  I'm seeing this patient by the request  of:  Koberlein, Steele Berg, MD  CC: back and neck pain   [id number]  MERA GUNKEL is a 51 y.o. female coming in with complaint of back and neck pain. OMT on 01/19/2021. Patient states that her SI joint on the R side has been bothering her. Patient is a Pharmacist, hospital and stands all day. Pain begins around lunch and persists throughout her day. Pain subsides when she can sit down. Last week her pain had increased for some reason. Pain with walking quickly last week. This has improved this week. Unsure as to why her pain has become worse recently. Wonders if walking for exercise is ok.   Medications patient has been prescribed: None  Taking:         Reviewed prior external information including notes and imaging from previsou exam, outside providers and external EMR if available.   As well as notes that were available from care everywhere and other healthcare systems.  Past medical history, social, surgical and family history all reviewed in electronic medical record.  No pertanent information unless stated regarding to the chief complaint.   Past Medical History:  Diagnosis Date   Chicken pox    Headache(784.0)    frequent   IBS (irritable bowel syndrome)    Infertility, female    Murmur    Palpitations    on meds   Scoliosis     Allergies  Allergen Reactions   Sulfa Antibiotics Hives    Childhood allergy      Review of Systems:  No headache, visual changes,  nausea, vomiting, diarrhea, constipation, dizziness, abdominal pain, skin rash, fevers, chills, night sweats, weight loss, swollen lymph nodes, body aches, joint swelling, chest pain, shortness of breath, mood changes. POSITIVE muscle aches  Objective  Blood pressure 138/82, pulse 62, height 5\' 4"  (1.626 m), weight 134 lb (60.8 kg), SpO2 98 %.   General: No apparent distress alert and oriented x3 mood and affect normal, dressed appropriately.  HEENT: Pupils equal, extraocular movements intact  Respiratory: Patient's speak in full sentences and does not appear short of breath  Low back exam does have significant tightness noted in the gluteal area in the piriformis muscle on the right side.  Patient does have positive FABER test noted.  Continuing to have some discomfort on exam.  Osteopathic findings   T6 extended rotated and side bent left L4 flexed rotated and side bent right Sacrum right on right       Assessment and Plan:  Idiopathic scoliosis Patient does have underlying scoliosis noted.  Does have tightness noted of the piriformis as well.  Patient has responded well to manipulation previously but states that it has slowed short-term. Patient continues to have pain that seems to be out of proportion to the amount of palpation that is finding.  If continuing to have difficulty do feel that advanced imaging could be warranted.  Follow-up again in 4 to 8 weeks.    Nonallopathic problems  Decision today to treat with OMT was based on  Physical Exam  After verbal consent patient was treated with HVLA, ME, FPR techniques in  thoracic, lumbar, and sacral  areas  Patient tolerated the procedure well with improvement in symptoms  Patient given exercises, stretches and lifestyle modifications  See medications in patient instructions if given  Patient will follow up in 4-8 weeks      The above documentation has been reviewed and is accurate and complete Lyndal Pulley,  DO        Note: This dictation was prepared with Dragon dictation along with smaller phrase technology. Any transcriptional errors that result from this process are unintentional.

## 2021-02-21 ENCOUNTER — Other Ambulatory Visit: Payer: Self-pay

## 2021-02-21 ENCOUNTER — Ambulatory Visit: Payer: BC Managed Care – PPO | Admitting: Family Medicine

## 2021-02-21 ENCOUNTER — Encounter: Payer: Self-pay | Admitting: Family Medicine

## 2021-02-21 VITALS — BP 138/82 | HR 62 | Ht 64.0 in | Wt 134.0 lb

## 2021-02-21 DIAGNOSIS — M9904 Segmental and somatic dysfunction of sacral region: Secondary | ICD-10-CM

## 2021-02-21 DIAGNOSIS — M9903 Segmental and somatic dysfunction of lumbar region: Secondary | ICD-10-CM

## 2021-02-21 DIAGNOSIS — M9902 Segmental and somatic dysfunction of thoracic region: Secondary | ICD-10-CM

## 2021-02-21 DIAGNOSIS — M4125 Other idiopathic scoliosis, thoracolumbar region: Secondary | ICD-10-CM | POA: Diagnosis not present

## 2021-02-21 MED ORDER — MELOXICAM 15 MG PO TABS: 15.0000 mg | ORAL_TABLET | Freq: Every day | ORAL | 0 refills | Status: DC

## 2021-02-21 NOTE — Assessment & Plan Note (Signed)
Patient does have underlying scoliosis noted.  Does have tightness noted of the piriformis as well.  Patient has responded well to manipulation previously but states that it has slowed short-term. Patient continues to have pain that seems to be out of proportion to the amount of palpation that is finding.  If continuing to have difficulty do feel that advanced imaging could be warranted.  Follow-up again in 4 to 8 weeks.

## 2021-02-21 NOTE — Patient Instructions (Signed)
Meloxicam 15mg  for 10 days Continue to do exercises Valentines gift should be mattress See me in 4-5 weeks

## 2021-03-08 ENCOUNTER — Ambulatory Visit: Payer: BC Managed Care – PPO | Admitting: Family Medicine

## 2021-03-29 NOTE — Progress Notes (Deleted)
?  Charlann Boxer D.O. ?[facility] ?[place] ?Phone: [phone] ?Subjective:   ? ?I'm seeing this patient by the request  of:  Caren Macadam, MD ? ?CC:  ? ?[id number]  ?Debra Johnson is a 51 y.o. female coming in with complaint of back and neck pain. OMT on 02/21/2021. Patient states  ? ?Medications patient has been prescribed: Meloxicam ? ?Taking: ? ? ?  ? ? ? ? ?Reviewed prior external information including notes and imaging from previsou exam, outside providers and external EMR if available.  ? ?As well as notes that were available from care everywhere and other healthcare systems. ? ?Past medical history, social, surgical and family history all reviewed in electronic medical record.  No pertanent information unless stated regarding to the chief complaint.  ? ?Past Medical History:  ?Diagnosis Date  ? Chicken pox   ? Headache(784.0)   ? frequent  ? IBS (irritable bowel syndrome)   ? Infertility, female   ? Murmur   ? Palpitations   ? on meds  ? Scoliosis   ?  ?Allergies  ?Allergen Reactions  ? Sulfa Antibiotics Hives  ?  Childhood allergy   ? ? ? ?Review of Systems: ? No headache, visual changes, nausea, vomiting, diarrhea, constipation, dizziness, abdominal pain, skin rash, fevers, chills, night sweats, weight loss, swollen lymph nodes, body aches, joint swelling, chest pain, shortness of breath, mood changes. POSITIVE muscle aches ? ?Objective  ?There were no vitals taken for this visit. ?  ?General: No apparent distress alert and oriented x3 mood and affect normal, dressed appropriately.  ?HEENT: Pupils equal, extraocular movements intact  ?Respiratory: Patient's speak in full sentences and does not appear short of breath  ?Cardiovascular: No lower extremity edema, non tender, no erythema  ?Neuro: Cranial nerves II through XII are intact, neurovascularly intact in all extremities with 2+ DTRs and 2+ pulses.  ?Gait normal with good balance and coordination.   ?MSK:  Non tender with full range of motion and good stability and symmetric strength and tone of shoulders, elbows, wrist, hip, knee and ankles bilaterally.  ?Back - Normal skin, Spine with normal alignment and no deformity.  No tenderness to vertebral process palpation.  Paraspinous muscles are not tender and without spasm.   Range of motion is full at neck and lumbar sacral regions ? ?Osteopathic findings ? ?C2 flexed rotated and side bent right ?C6 flexed rotated and side bent left ?T3 extended rotated and side bent right inhaled rib ?T9 extended rotated and side bent left ?L2 flexed rotated and side bent right ?Sacrum right on right ? ? ? ? ?  ?Assessment and Plan: ? ? ? ?Nonallopathic problems ? ?Decision today to treat with OMT was based on Physical Exam ? ?After verbal consent patient was treated with HVLA, ME, FPR techniques in cervical, rib, thoracic, lumbar, and sacral  areas ? ?Patient tolerated the procedure well with improvement in symptoms ? ?Patient given exercises, stretches and lifestyle modifications ? ?See medications in patient instructions if given ? ?Patient will follow up in 4-8 weeks ? ?  ? ? ?The above documentation has been reviewed and is accurate and complete Delsa Sale ? ? ?  ? ? Note: This dictation was prepared with Dragon dictation along with smaller phrase technology. Any transcriptional errors that result from this process are unintentional.    ?  ?  ? ?

## 2021-03-30 ENCOUNTER — Ambulatory Visit: Payer: BC Managed Care – PPO | Admitting: Family Medicine

## 2021-05-10 ENCOUNTER — Telehealth (INDEPENDENT_AMBULATORY_CARE_PROVIDER_SITE_OTHER): Payer: BC Managed Care – PPO | Admitting: Internal Medicine

## 2021-05-10 ENCOUNTER — Encounter: Payer: Self-pay | Admitting: Internal Medicine

## 2021-05-10 VITALS — Temp 97.9°F

## 2021-05-10 DIAGNOSIS — Z20818 Contact with and (suspected) exposure to other bacterial communicable diseases: Secondary | ICD-10-CM

## 2021-05-10 DIAGNOSIS — Z8709 Personal history of other diseases of the respiratory system: Secondary | ICD-10-CM

## 2021-05-10 DIAGNOSIS — J020 Streptococcal pharyngitis: Secondary | ICD-10-CM

## 2021-05-10 LAB — POC COVID19 BINAXNOW: SARS Coronavirus 2 Ag: NEGATIVE

## 2021-05-10 LAB — POCT RAPID STREP A (OFFICE): Rapid Strep A Screen: POSITIVE — AB

## 2021-05-10 MED ORDER — AMOXICILLIN 400 MG/5ML PO SUSR: 400.0000 mg | Freq: Three times a day (TID) | ORAL | 0 refills | Status: AC

## 2021-05-10 NOTE — Progress Notes (Signed)
?Virtual Visit via Video Note ? ?I connected with FLORICE HINDLE on 05/10/21 at 12:00 PM EDT by a video enabled telemedicine application and verified that I am speaking with the correct person using two identifiers. ?Location patient: home ?Location provider: home office ?Persons participating in the virtual visit: patient, provider ? ?[place] national recommendations  regarding COVID 19 pandemic   video visit is advised over in office visit for this patient.  ?Patient aware  of the limitations of evaluation and management by telemedicine and  availability of in person appointments. and agreed to proceed. ? ? ?HPI: ?RAENA PAU presents for video visit because of new onset sore throat symptoms. 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  However about 4 to 5 days ago her sore throat became more persistent and she noticed a white spot on her right tonsil and today on both tonsils.  No fever but has some tenderness in the right anterior neck possible lymph node swelling.  No significant headache now cough or serious congestion. ? ?1 child in her class has had strep throat and an adjacent class has 7 children out with strep throat. ? ?She was also treated in December for positive strep test with severe sore throat in office.  Liquid amoxicillin symptoms resolved. ? ? ?ROS: See pertinent positives and negatives per HPI. ? ?Past Medical History:  ?Diagnosis Date  ? Chicken pox   ? Headache(784.0)   ? frequent  ? IBS (irritable bowel syndrome)   ? Infertility, female   ? Murmur   ? Palpitations   ? on meds  ? Scoliosis   ? ? ?Past Surgical History:  ?Procedure Laterality Date  ? IVF treatment    ? WISDOM TOOTH EXTRACTION    ? WISDOM TOOTH EXTRACTION    ? ? ?Family History  ?Problem Relation Age of Onset  ? Asthma Mother   ? Healthy Father   ? Colon cancer Paternal Grandfather   ? Colon polyps Paternal Grandfather 93  ? Lung cancer Maternal  Grandfather   ? Allergies Sister   ? Asthma Sister   ? Healthy Brother   ? Dementia Paternal Grandmother   ? Esophageal cancer Neg Hx   ? Rectal cancer Neg Hx   ? Stomach cancer Neg Hx   ? ? ?Social History  ? ?Tobacco Use  ? Smoking status: Never  ? Smokeless tobacco: Never  ?Vaping Use  ? Vaping Use: Never used  ?Substance Use Topics  ? Alcohol use: No  ? Drug use: No  ? ? ? ? ?Current Outpatient Medications:  ?  Acetaminophen (TYLENOL PO), Take by mouth as needed., Disp: , Rfl:  ?  amoxicillin (AMOXIL) 400 MG/5ML suspension, Take 5 mLs (400 mg total) by mouth 3 (three) times daily for 10 days. For strep throat, Disp: 150 mL, Rfl: 0 ?  ibuprofen (ADVIL,MOTRIN) 200 MG tablet, Take 200 mg by mouth every 6 (six) hours as needed for moderate pain., Disp: , Rfl:  ?  metoprolol succinate (TOPROL-XL) 50 MG 24 hr tablet, Take 1 tablet (50 mg total) by mouth daily., Disp: 90 tablet, Rfl: 3 ? ?EXAM: ?BP Readings from Last 3 Encounters:  ?02/21/21 138/82  ?01/19/21 118/76  ?01/18/21 120/70  ? ? ?VITALS per patient if applicable: ? ?GENERAL: alert, oriented, appears well and in no acute distress ? ?HEENT: atraumatic, conjunttiva clear, no obvious abnormalities on inspection of external nose and ears no  face no facial edema ? ?NECK: normal movements of the head and neck points to right anterior neck as area of a tender gland but no obvious swelling on visual. ? ?LUNGS: on inspection no signs of respiratory distress, breathing rate appears normal, no obvious gross SOB, gasping or wheezing ? ?CV: no obvious cyanosis ? ?MS: moves all visible extremities without noticeable abnormality ? ?PSYCH/NEURO: pleasant and cooperative, no obvious depression or anxiety, speech and thought processing grossly intact ? ? ?ASSESSMENT AND PLAN: ? ?Discussed the following assessment and plan: ? ?  ICD-10-CM   ?1. Pharyngitis due to Streptococcus species  J02.0 POC Rapid Strep A  ?  POC COVID-19  ?  ?2. Exposure to group A Streptococcus  Z20.818    ?  ?3. History of strep pharyngitis  Z87.09   ?  ? ?Suspicious for strep pharyngitis with past history of same and positive contact and reported exudate without serious cough. ?She will come to the office for rapid strep and rapid COVID. ?Even if rapid is negative would probably empirically treat because of her strong exposure history and clinical scenario. ?She reports that is difficult for her to swallow pills and prefers liquid antibiotic or chewables as possible her pharmacy is targeting high weight. ?Counseled.  ? Expectant management and discussion of plan and treatment with opportunity to ask questions and all were answered. The patient agreed with the plan and demonstrated an understanding of the instructions. ? strep rapid was positive   amox susp 400 tid x 10 d sent to her pharmacy ?Advised to call back or seek an in-person evaluation if worsening  or having  further concerns  in interim. ?Return if symptoms worsen or fail to improve. ? ? ? ?Shanon Ace, MD  ?

## 2021-09-12 ENCOUNTER — Other Ambulatory Visit: Payer: Self-pay | Admitting: Internal Medicine

## 2021-09-12 DIAGNOSIS — Z1231 Encounter for screening mammogram for malignant neoplasm of breast: Secondary | ICD-10-CM

## 2021-09-22 ENCOUNTER — Ambulatory Visit
Admission: RE | Admit: 2021-09-22 | Discharge: 2021-09-22 | Disposition: A | Discharge status: Home / Self Care | Payer: BC Managed Care – PPO | Source: Ambulatory Visit | Attending: Internal Medicine | Admitting: Internal Medicine

## 2021-09-22 DIAGNOSIS — Z1231 Encounter for screening mammogram for malignant neoplasm of breast: Secondary | ICD-10-CM

## 2021-10-16 ENCOUNTER — Encounter: Payer: Self-pay | Admitting: Family Medicine

## 2021-10-16 ENCOUNTER — Ambulatory Visit (INDEPENDENT_AMBULATORY_CARE_PROVIDER_SITE_OTHER): Payer: BC Managed Care – PPO | Admitting: Family Medicine

## 2021-10-16 VITALS — BP 140/80 | HR 65 | Temp 97.7°F | Ht 64.0 in | Wt 137.5 lb

## 2021-10-16 DIAGNOSIS — K582 Mixed irritable bowel syndrome: Secondary | ICD-10-CM | POA: Diagnosis not present

## 2021-10-16 DIAGNOSIS — R030 Elevated blood-pressure reading, without diagnosis of hypertension: Secondary | ICD-10-CM

## 2021-10-16 DIAGNOSIS — G8929 Other chronic pain: Secondary | ICD-10-CM

## 2021-10-16 DIAGNOSIS — M545 Low back pain, unspecified: Secondary | ICD-10-CM | POA: Diagnosis not present

## 2021-10-16 DIAGNOSIS — R002 Palpitations: Secondary | ICD-10-CM | POA: Diagnosis not present

## 2021-10-16 DIAGNOSIS — K589 Irritable bowel syndrome without diarrhea: Secondary | ICD-10-CM | POA: Insufficient documentation

## 2021-10-16 NOTE — Patient Instructions (Signed)
Florastor or Align are two good brands of probiotics that might help regulate your bowels.  20-30 grams of fiber daily.

## 2021-10-16 NOTE — Progress Notes (Signed)
Established Patient Office Visit  Subjective   Patient ID: Debra Johnson, female    DOB: [birth year]  Age: 51 y.o. MRN: [id number]  Chief Complaint  Patient presents with   Establish Care    Patient is here for transition of care visit. Patient is up to 50 mg of metoprolol daily, states they are treating heart palpitations with it. This is a long term problem that she has had for most of her life, has had work up for this in the past, sees Dr. Stanford Breed, cardiologist who is prescribing the medication for her. She denies any side effects to the medication, states that the 50 mg is working well for her.   BP was performed in office today and is elevated. She reports she is checking her BP at home regularly and usually her pressure is in the 110-120 range. She states that she has some lower back pain recently, has been moving boxes and items out of her kitchen for a remodeling project. Has seen Dr. Tamala Julian with Sports medicine in the past and was receiving OMT, uses OTC NSAIDS for pain relief.    Current Outpatient Medications  Medication Instructions   Acetaminophen (TYLENOL PO) Oral, As needed   ibuprofen (ADVIL) 200 mg, Oral, Every 6 hours PRN   metoprolol succinate (TOPROL-XL) 50 mg, Oral, Daily     Patient Active Problem List   Diagnosis Date Noted   Elevated blood pressure reading 10/20/2021   Chronic low back pain without sciatica 10/20/2021   Irritable bowel 10/16/2021   Somatic dysfunction of spine, cervical 01/19/2021   Idiopathic scoliosis 12/05/2020   Scapular dyskinesis 12/05/2020   Migraine 09/28/2016   Murmur 03/03/2012   Heart palpitations 02/28/2012      Review of Systems  All other systems reviewed and are negative.     Objective:     BP (!) 140/80 (BP Location: Right Arm, Cuff Size: Normal) Comment: repeated by Mykal--jaf  Pulse 65   Temp 97.7 F (36.5 C) (Oral)   Ht '5\' 4"'$  (1.626 m)   Wt 137 lb 8 oz (62.4 kg)   SpO2 98%   BMI 23.60 kg/m  BP  Readings from Last 3 Encounters:  10/16/21 (!) 140/80  02/21/21 138/82  01/19/21 118/76      Physical Exam Vitals reviewed.  Constitutional:      Appearance: Normal appearance. She is well-groomed and normal weight.  Eyes:     Extraocular Movements: Extraocular movements intact.     Conjunctiva/sclera: Conjunctivae normal.  Neck:     Thyroid: No thyromegaly.  Cardiovascular:     Rate and Rhythm: Normal rate and regular rhythm.     Heart sounds: S1 normal and S2 normal.  Pulmonary:     Effort: Pulmonary effort is normal.     Breath sounds: Normal breath sounds and air entry.  Abdominal:     General: Abdomen is flat. Bowel sounds are normal.  Musculoskeletal:     Cervical back: Normal range of motion and neck supple.     Right lower leg: No edema.     Left lower leg: No edema.  Skin:    General: Skin is warm and dry.  Neurological:     Mental Status: She is alert and oriented to person, place, and time. Mental status is at baseline.     Gait: Gait is intact.  Psychiatric:        Mood and Affect: Mood and affect normal.        Speech:  Speech normal.        Behavior: Behavior normal.        Judgment: Judgment normal.      No results found for any visits on 10/16/21.    The 10-year ASCVD risk score (Arnett DK, et al., 2019) is: 1.6%    Assessment & Plan:   Problem List Items Addressed This Visit       Digestive   Irritable bowel (Chronic)    Chronic, alternates between constipation and diarrhea, worse with emotional stress and anxiety, her father passed away this summer which caused worsening of her symptoms. I recommended adding probiotics twice a day to her regimen with her fiber supplements. If the problem continues then further work up will be necessary (rule out celiac disease, lactose intolerance, etc.).        Other   Heart palpitations - Primary    On 50 mg daily of metoprolol, she reports good control of her symptoms with the medication, denies any  SOB, dizziness or chest pain. She continues to follow with her cardiologist.       Elevated blood pressure reading    New finding, It could be due to pain from her back. I reviewed previous blood pressures and she is usually under 140. I advised she continue to check her BP at home and if her BP is greater than 140 consistently then she should return to office.      Chronic low back pain without sciatica (Chronic)    Bilateral, intermittent troubles in the past, now aggravated by moving heavy boxes in her kitchen. I advised continued use of OTC NSAIDS, muscle rubs, heat or ice, etc. She is already a patient of Dr. Tamala Julian so if the pain is persistent she should call and schedule with him.       Return in about 4 weeks (around 11/13/2021) for Pap smear/ annual physical.    Farrel Conners, MD

## 2021-10-16 NOTE — Assessment & Plan Note (Addendum)
Chronic, alternates between constipation and diarrhea, worse with emotional stress and anxiety, her father passed away this summer which caused worsening of her symptoms. I recommended adding probiotics twice a day to her regimen with her fiber supplements. If the problem continues then further work up will be necessary (rule out celiac disease, lactose intolerance, etc.).

## 2021-10-20 DIAGNOSIS — G8929 Other chronic pain: Secondary | ICD-10-CM | POA: Insufficient documentation

## 2021-10-20 DIAGNOSIS — R030 Elevated blood-pressure reading, without diagnosis of hypertension: Secondary | ICD-10-CM | POA: Insufficient documentation

## 2021-10-20 NOTE — Assessment & Plan Note (Signed)
Bilateral, intermittent troubles in the past, now aggravated by moving heavy boxes in her kitchen. I advised continued use of OTC NSAIDS, muscle rubs, heat or ice, etc. She is already a patient of Dr. Tamala Julian so if the pain is persistent she should call and schedule with him.

## 2021-10-20 NOTE — Assessment & Plan Note (Signed)
New finding, It could be due to pain from her back. I reviewed previous blood pressures and she is usually under 140. I advised she continue to check her BP at home and if her BP is greater than 140 consistently then she should return to office.

## 2021-10-20 NOTE — Assessment & Plan Note (Signed)
On 50 mg daily of metoprolol, she reports good control of her symptoms with the medication, denies any SOB, dizziness or chest pain. She continues to follow with her cardiologist.

## 2021-10-25 ENCOUNTER — Encounter: Payer: Self-pay | Admitting: Cardiology

## 2021-11-13 ENCOUNTER — Other Ambulatory Visit (HOSPITAL_COMMUNITY)
Admission: RE | Admit: 2021-11-13 | Discharge: 2021-11-13 | Disposition: A | Discharge status: Home / Self Care | Payer: BC Managed Care – PPO | Source: Ambulatory Visit | Attending: Family Medicine | Admitting: Family Medicine

## 2021-11-13 ENCOUNTER — Encounter: Payer: BC Managed Care – PPO | Admitting: Family Medicine

## 2021-11-13 ENCOUNTER — Encounter: Payer: Self-pay | Admitting: Family Medicine

## 2021-11-13 ENCOUNTER — Ambulatory Visit (INDEPENDENT_AMBULATORY_CARE_PROVIDER_SITE_OTHER): Payer: BC Managed Care – PPO | Admitting: Family Medicine

## 2021-11-13 VITALS — BP 120/80 | HR 70 | Temp 98.1°F | Ht 65.0 in | Wt 140.9 lb

## 2021-11-13 DIAGNOSIS — Z01419 Encounter for gynecological examination (general) (routine) without abnormal findings: Secondary | ICD-10-CM

## 2021-11-13 DIAGNOSIS — Z124 Encounter for screening for malignant neoplasm of cervix: Secondary | ICD-10-CM | POA: Diagnosis present

## 2021-11-13 DIAGNOSIS — Z0000 Encounter for general adult medical examination without abnormal findings: Secondary | ICD-10-CM

## 2021-11-13 DIAGNOSIS — Z23 Encounter for immunization: Secondary | ICD-10-CM | POA: Diagnosis not present

## 2021-11-13 LAB — COMPREHENSIVE METABOLIC PANEL
ALT: 14 U/L (ref 0–35)
AST: 17 U/L (ref 0–37)
Albumin: 4.6 g/dL (ref 3.5–5.2)
Alkaline Phosphatase: 97 U/L (ref 39–117)
BUN: 12 mg/dL (ref 6–23)
CO2: 28 mEq/L (ref 19–32)
Calcium: 9.9 mg/dL (ref 8.4–10.5)
Chloride: 101 mEq/L (ref 96–112)
Creatinine, Ser: 1.01 mg/dL (ref 0.40–1.20)
GFR: 64.02 mL/min (ref >60.00)
Glucose, Bld: 108 mg/dL — ABNORMAL HIGH (ref 70–99)
Potassium: 3.8 mEq/L (ref 3.5–5.1)
Sodium: 137 mEq/L (ref 135–145)
Total Bilirubin: 0.6 mg/dL (ref 0.2–1.2)
Total Protein: 7.4 g/dL (ref 6.0–8.3)

## 2021-11-13 LAB — LIPID PANEL
Cholesterol: 201 mg/dL — ABNORMAL HIGH (ref 0–200)
HDL: 57.1 mg/dL (ref >39.00)
LDL Cholesterol: 124 mg/dL — ABNORMAL HIGH (ref 0–99)
NonHDL: 143.6
Total CHOL/HDL Ratio: 4
Triglycerides: 100 mg/dL (ref 0.0–149.0)
VLDL: 20 mg/dL (ref 0.0–40.0)

## 2021-11-13 NOTE — Assessment & Plan Note (Signed)
Pap smear obtained during the visit. Normal GYN exam today.

## 2021-11-13 NOTE — Progress Notes (Signed)
Complete physical exam  Patient: Debra Johnson   DOB: 10/03/1970   51 y.o. Female  MRN: [id number]  Subjective:    Chief Complaint  Patient presents with   Annual Exam    Debra Johnson is a 51 y.o. female who presents today for a complete physical exam. She reports consuming a general diet. Home exercise routine includes walking 0.5 hrs per day. She generally feels well. She reports sleeping well. She does not have additional problems to discuss today.    Most recent fall risk assessment:     No data to display           Most recent depression screenings:    11/13/2021    9:24 AM 10/16/2021    3:54 PM  PHQ 2/9 Scores  PHQ - 2 Score 0 0  PHQ- 9 Score 1 1    Dental: No current dental problems and Receives regular dental care  Patient Active Problem List   Diagnosis Date Noted   Healthy adult on routine physical examination 11/13/2021   Visit for routine gyn exam 11/13/2021   Elevated blood pressure reading 10/20/2021   Chronic low back pain without sciatica 10/20/2021   Irritable bowel 10/16/2021   Somatic dysfunction of spine, cervical 01/19/2021   Idiopathic scoliosis 12/05/2020   Scapular dyskinesis 12/05/2020   Migraine 09/28/2016   Murmur 03/03/2012   Heart palpitations 02/28/2012      Patient Care Team: Farrel Conners, MD as PCP - General (Family Medicine)   Outpatient Medications Prior to Visit  Medication Sig   Acetaminophen (TYLENOL PO) Take by mouth as needed.   ibuprofen (ADVIL,MOTRIN) 200 MG tablet Take 200 mg by mouth every 6 (six) hours as needed for moderate pain.   metoprolol succinate (TOPROL-XL) 50 MG 24 hr tablet Take 1 tablet (50 mg total) by mouth daily.   No facility-administered medications prior to visit.    Review of Systems  HENT:  Negative for hearing loss.   Eyes:  Negative for blurred vision.  Respiratory:  Negative for shortness of breath.   Cardiovascular:  Negative for chest pain.  Gastrointestinal: Negative.    Genitourinary: Negative.   Musculoskeletal:  Negative for back pain.  Neurological:  Negative for headaches.  Psychiatric/Behavioral:  Negative for depression.   All other systems reviewed and are negative.         Objective:     BP 120/80 (BP Location: Left Arm, Patient Position: Sitting, Cuff Size: Normal)   Pulse 70   Temp 98.1 F (36.7 C) (Oral)   Ht '5\' 5"'$  (1.651 m)   Wt 140 lb 14.4 oz (63.9 kg)   LMP 05/29/2021 (Approximate)   SpO2 99%   BMI 23.45 kg/m    Physical Exam Vitals reviewed. Exam conducted with a chaperone present.  Constitutional:      Appearance: She is normal weight.  Cardiovascular:     Rate and Rhythm: Normal rate and regular rhythm.     Heart sounds: Normal heart sounds.  Pulmonary:     Effort: Pulmonary effort is normal.  Chest:     Chest wall: No mass.  Abdominal:     General: Abdomen is flat. Bowel sounds are normal.     Palpations: Abdomen is soft.  Genitourinary:    General: Normal vulva.     Exam position: Lithotomy position.     Tanner stage (genital): 5.     Labia:        Right: No lesion.  Left: No lesion.      Vagina: Normal.     Cervix: No discharge or lesion.     Uterus: Normal.      Adnexa: Right adnexa normal and left adnexa normal.       Left: No mass or tenderness.       Rectum: External hemorrhoid (larger hemorrhoid in the 12 o'clock position) present.  Neurological:     General: No focal deficit present.     Mental Status: She is alert and oriented to person, place, and time.  Psychiatric:        Mood and Affect: Mood and affect normal.      No results found for any visits on 11/13/21.     Assessment & Plan:    Routine Health Maintenance and Physical Exam  Immunization History  Administered Date(s) Administered   Influenza,inj,Quad PF,6+ Mos 11/05/2018, 11/09/2019, 12/09/2020, 11/13/2021   PFIZER(Purple Top)SARS-COV-2 Vaccination 03/27/2019, 04/17/2019   Td 06/30/1999   Tdap 11/20/2011   Zoster  Recombinat (Shingrix) 11/09/2019, 03/04/2020    Health Maintenance  Topic Date Due   PAP SMEAR-Modifier  11/04/2021   COVID-19 Vaccine (3 - Pfizer series) 11/29/2021 (Originally 06/12/2019)   HIV Screening  11/14/2022 (Originally 05/23/1984)   TETANUS/TDAP  11/19/2021   MAMMOGRAM  09/23/2023   COLONOSCOPY (Pts 45-32yr Insurance coverage will need to be confirmed)  05/18/2027   INFLUENZA VACCINE  Completed   Hepatitis C Screening  Completed   Zoster Vaccines- Shingrix  Completed   HPV VACCINES  Aged Out    Discussed health benefits of physical activity, and encouraged her to engage in regular exercise appropriate for her age and condition.  Problem List Items Addressed This Visit       Other   Healthy adult on routine physical examination   Visit for routine gyn exam    Pap smear obtained during the visit. Normal GYN exam today.      Other Visit Diagnoses     Need for immunization against influenza    -  Primary   Relevant Orders   Flu Vaccine QUAD 6+ mos PF IM (Fluarix Quad PF) (Completed)   Annual physical exam       Relevant Orders   CMP   Lipid Panel   Cervical cancer screening       Relevant Orders   PAP [[place]]      Return in about 1 year (around 11/14/2022) for Annual physical.     BFarrel Conners MD

## 2021-11-14 LAB — CYTOLOGY - PAP
Adequacy: ABSENT
Comment: NEGATIVE
Diagnosis: NEGATIVE
High risk HPV: NEGATIVE

## 2021-12-08 ENCOUNTER — Other Ambulatory Visit: Payer: Self-pay | Admitting: Cardiology

## 2021-12-08 DIAGNOSIS — R002 Palpitations: Secondary | ICD-10-CM

## 2021-12-24 NOTE — Progress Notes (Signed)
Cardiology Clinic Note   Patient Name: Debra Johnson Date of Encounter: 12/25/2021  Primary Care Provider:  Farrel Conners, MD Primary Cardiologist:  Kirk Ruths, MD  Patient Profile     Debra Johnson 51 year old female presents to the clinic today for follow-up evaluation of her palpitations.  Past Medical History    Past Medical History:  Diagnosis Date   Chicken pox    Headache(784.0)    frequent   IBS (irritable bowel syndrome)    Infertility, female    Murmur    Palpitations    on meds   Scoliosis    Past Surgical History:  Procedure Laterality Date   IVF treatment     WISDOM TOOTH EXTRACTION     WISDOM TOOTH EXTRACTION      Allergies  Allergies  Allergen Reactions   Sulfa Antibiotics Hives    Childhood allergy     History of Present Illness    Debra Johnson has a PMH of palpitations, cardiac murmur, chronic low back pain without sciatica, IBS, migraine, scapular dyskinesis and hypercholesterolemia.  Echocardiogram 7/19 showed normal LV function, mild mitral regurgitation.  Her cardiac event monitor 7/19 showed sinus rhythm with rare PACs and PVCs.  She was seen in follow-up by Dr. Stanford Breed on 12/06/2021.  During that time she denied dyspnea, chest pain, and syncope.  She did note that her palpitations were occasionally more persistent and she described them as a skipped.  It was felt that her palpitations were secondary to PACs and PVCs.  Her metoprolol was increased to 50 mg daily.  It was felt that dietary changes would be the best course of treatment for her hyperlipidemia.  She presents to the clinic today for follow-up evaluation and states she is doing well from a cardiac standpoint.  She notices occasional episodes of extra heartbeat.  She reports that she has had these episodes since she was in middle school.  She continually had evaluation through her 5s 68s and 67s.  She was started on beta-blocker therapy initially at 25 mg daily and  had her metoprolol increased last year to 50 mg.  She continues to stay physically active walking most days of the week and is active with her prescribed class.  She avoids caffeine and does not appear to have sleep apnea.  We reviewed triggers for palpitations.  She expressed understanding.  I will continue her metoprolol, have her continue to avoid triggers for palpitations, and plan follow-up for 1 year.  She recently had her lipid panel drawn with her PCP.  Her LDL was noted to be 24.  We reviewed the importance of high-fiber diet.  Today she denies chest pain, shortness of breath, lower extremity edema, fatigue, palpitations, melena, hematuria, hemoptysis, diaphoresis, weakness, presyncope, syncope, orthopnea, and PND.   Home Medications    Prior to Admission medications   Medication Sig Start Date End Date Taking? Authorizing Provider  Acetaminophen (TYLENOL PO) Take by mouth as needed.    [provider]  ibuprofen (ADVIL,MOTRIN) 200 MG tablet Take 200 mg by mouth every 6 (six) hours as needed for moderate pain.    [provider]  metoprolol succinate (TOPROL-XL) 50 MG 24 hr tablet TAKE 1 TABLET BY MOUTH EVERY DAY 12/08/21   Lelon Perla, MD    Family History    Family History  Problem Relation Age of Onset   Asthma Mother    Healthy Father    Colon cancer Paternal Grandfather  Colon polyps Paternal Grandfather 24   Lung cancer Maternal Grandfather    Allergies Sister    Asthma Sister    Healthy Brother    Dementia Paternal Grandmother    Esophageal cancer Neg Hx    Rectal cancer Neg Hx    Stomach cancer Neg Hx    She indicated that her mother is alive. She indicated that her father is alive. She indicated that her sister is alive. She indicated that her brother is alive. She indicated that her maternal grandmother is deceased. She indicated that her maternal grandfather is deceased. She indicated that her paternal grandmother is deceased. She indicated  that her paternal grandfather is deceased. She indicated that the status of her neg hx is unknown.  Social History    Social History   Socioeconomic History   Marital status: Married    Spouse name: Not on file   Number of children: 2   Years of education: Not on file   Highest education level: Not on file  Occupational History    Comment: First grade teacher  Tobacco Use   Smoking status: Never   Smokeless tobacco: Never  Vaping Use   Vaping Use: Never used  Substance and Sexual Activity   Alcohol use: No   Drug use: No   Sexual activity: Yes  Other Topics Concern   Not on file  Social History Narrative          Work or School: [employer]              Home Situation: has two adopted children             Spiritual Beliefs: methodist              Lifestyle: no regular exercise, heathy diet               Social Determinants of Health   Financial Resource Strain: Not on file  Food Insecurity: Not on file  Transportation Needs: Not on file  Physical Activity: Not on file  Stress: Not on file  Social Connections: Not on file  Intimate Partner Violence: Not on file     Review of Systems    General:  No chills, fever, night sweats or weight changes.  Cardiovascular:  No chest pain, dyspnea on exertion, edema, orthopnea, palpitations, paroxysmal nocturnal dyspnea. Dermatological: No rash, lesions/masses Respiratory: No cough, dyspnea Urologic: No hematuria, dysuria Abdominal:   No nausea, vomiting, diarrhea, bright red blood per rectum, melena, or hematemesis Neurologic:  No visual changes, wkns, changes in mental status. All other systems reviewed and are otherwise negative except as noted above.  Physical Exam    VS:  BP 120/66 (BP Location: Left Arm, Patient Position: Sitting, Cuff Size: Normal)   Pulse (!) 59   Ht '5\' 4"'$  (1.626 m)   Wt 140 lb 12.8 oz (63.9 kg)   BMI 24.17 kg/m  , BMI Body mass index is 24.17 kg/m. GEN: Well nourished, well  developed, in no acute distress. HEENT: normal. Neck: Supple, no JVD, carotid bruits, or masses. Cardiac: RRR, no murmurs, rubs, or gallops. No clubbing, cyanosis, edema.  Radials/DP/PT 2+ and equal bilaterally.  Respiratory:  Respirations regular and unlabored, clear to auscultation bilaterally. GI: Soft, nontender, nondistended, BS + x 4. MS: no deformity or atrophy. Skin: warm and dry, no rash. Neuro:  Strength and sensation are intact. Psych: Normal affect.  Accessory Clinical Findings    Recent Labs: 11/13/2021: ALT 14; BUN 12;  Creatinine, Ser 1.01; Potassium 3.8; Sodium 137   Recent Lipid Panel    Component Value Date/Time   CHOL 201 (H) 11/13/2021 1036   TRIG 100.0 11/13/2021 1036   HDL 57.10 11/13/2021 1036   CHOLHDL 4 11/13/2021 1036   VLDL 20.0 11/13/2021 1036   LDLCALC 124 (H) 11/13/2021 1036         ECG personally reviewed by me today-  - No acute changes  Echocardiogram 07/31/2017  Study Conclusions   - Left ventricle: The cavity size was normal. Wall thickness was    normal. Systolic function was vigorous. The estimated ejection    fraction was in the range of 65% to 70%. Wall motion was normal;    there were no regional wall motion abnormalities. Left    ventricular diastolic function parameters were normal.  - Mitral valve: Mildly thickened leaflets . Systolic bowing without    prolapse. Trace to mild regurgitation.  - Left atrium: The atrium was normal in size.  - Inferior vena cava: The vessel was normal in size. The    respirophasic diameter changes were in the normal range (>= 50%),    consistent with normal central venous pressure.   Impressions:   - Essentially normal study. Late systolic bowing of the mitral    valve with trace to mild regurgitation is noted. Normal LA size.   Assessment & Plan   1.  Palpitations-improvement in episodes of palpitations with increased metoprolol dosing. Avoid triggers caffeine, chocolate, EtOH, dehydration  etc. Increase physical activity as tolerated  Hyperlipidemia-LDL 124 11/13/21.  No history of vascular disease.  Reviewed the importance of maintaining physical activity and high-fiber diet.  Disposition: Follow-up with Dr. Stanford Breed in 1 year.   Jossie Ng. Jaben Benegas NP-C     12/25/2021, 4:12 PM [place] [address] Office [phone] Fax [phone]    I spent 14 minutes examining this patient, reviewing medications, and using patient centered shared decision making involving her cardiac care.  Prior to her visit I spent greater than 20 minutes reviewing her past medical history,  medications, and prior cardiac tests.

## 2021-12-25 ENCOUNTER — Ambulatory Visit: Payer: BC Managed Care – PPO | Attending: General Practice | Admitting: General Practice

## 2021-12-25 ENCOUNTER — Encounter: Payer: Self-pay | Admitting: General Practice

## 2021-12-25 VITALS — BP 120/66 | HR 59 | Ht 64.0 in | Wt 140.8 lb

## 2021-12-25 DIAGNOSIS — R002 Palpitations: Secondary | ICD-10-CM

## 2021-12-25 DIAGNOSIS — E7800 Pure hypercholesterolemia, unspecified: Secondary | ICD-10-CM | POA: Diagnosis not present

## 2021-12-25 NOTE — Patient Instructions (Signed)
Medication Instructions:  The current medical regimen is effective;  continue present plan and medications as directed. Please refer to the Current Medication list given to you today.  *If you need a refill on your cardiac medications before your next appointment, please call your pharmacy*  Lab Work: NONE If you have labs (blood work) drawn today and your tests are completely normal, you will receive your results only by: [place] (if you have MyChart) OR A paper copy in the mail If you have any lab test that is abnormal or we need to change your treatment, we will call you to review the results.  Testing/Procedures: NONE  Follow-Up: At [facility], you and your health needs are our priority.  As part of our continuing mission to provide you with exceptional heart care, we have created designated Provider Care Teams.  These Care Teams include your primary Cardiologist (physician) and Advanced Practice Providers (APPs -  Physician Assistants and Nurse Practitioners) who all work together to provide you with the care you need, when you need it.  Your next appointment:   12 month(s)  The format for your next appointment:   In Person  Provider:   Kirk Ruths, MD    Other Instructions MAINTAIN PHYSICAL ACTIVITY  Please try to avoid these triggers: Do not use any products that have nicotine or tobacco in them. These include cigarettes, e-cigarettes, and chewing tobacco. If you need help quitting, ask your doctor. Eat heart-healthy foods. Talk with your doctor about the right eating plan for you. Exercise regularly as told by your doctor. Stay hydrated Do not drink alcohol, Caffeine or chocolate. Lose weight if you are overweight. Do not use drugs, including cannabis   Important Information About Sugar       High-Fiber Eating Plan Fiber, also called dietary fiber, is a type of carbohydrate. It is found foods such as fruits, vegetables, whole grains, and beans.  A high-fiber diet can have many health benefits. Your health care provider may recommend a high-fiber diet to help: Prevent constipation. Fiber can make your bowel movements more regular. Lower your cholesterol. Relieve the following conditions: Inflammation of veins in the anus (hemorrhoids). Inflammation of specific areas of the digestive tract (uncomplicated diverticulosis). A problem of the large intestine, also called the colon, that sometimes causes pain and diarrhea (irritable bowel syndrome, or IBS). Prevent overeating as part of a weight-loss plan. Prevent heart disease, type 2 diabetes, and certain cancers. What are tips for following this plan? Reading food labels  Check the nutrition facts label on food products for the amount of dietary fiber. Choose foods that have 5 grams of fiber or more per serving. The goals for recommended daily fiber intake include: Men (age 17 or younger): 34-38 g. Men (over age 87): 28-34 g. Women (age 16 or younger): 25-28 g. Women (over age 39): 22-25 g. Your daily fiber goal is _____________ g. Shopping Choose whole fruits and vegetables instead of processed forms, such as apple juice or applesauce. Choose a wide variety of high-fiber foods such as avocados, lentils, oats, and kidney beans. Read the nutrition facts label of the foods you choose. Be aware of foods with added fiber. These foods often have high sugar and sodium amounts per serving. Cooking Use whole-grain flour for baking and cooking. Cook with brown rice instead of white rice. Meal planning Start the day with a breakfast that is high in fiber, such as a cereal that contains 5 g of fiber or more  per serving. Eat breads and cereals that are made with whole-grain flour instead of refined flour or white flour. Eat brown rice, bulgur wheat, or millet instead of white rice. Use beans in place of meat in soups, salads, and pasta dishes. Be sure that half of the grains you eat each day  are whole grains. General information You can get the recommended daily intake of dietary fiber by: Eating a variety of fruits, vegetables, grains, nuts, and beans. Taking a fiber supplement if you are not able to take in enough fiber in your diet. It is better to get fiber through food than from a supplement. Gradually increase how much fiber you consume. If you increase your intake of dietary fiber too quickly, you may have bloating, cramping, or gas. Drink plenty of water to help you digest fiber. Choose high-fiber snacks, such as berries, raw vegetables, nuts, and popcorn. What foods should I eat? Fruits Berries. Pears. Apples. Oranges. Avocado. Prunes and raisins. Dried figs. Vegetables Sweet potatoes. Spinach. Kale. Artichokes. Cabbage. Broccoli. Cauliflower. Green peas. Carrots. Squash. Grains Whole-grain breads. Multigrain cereal. Oats and oatmeal. Brown rice. Barley. Bulgur wheat. [place]. Quinoa. Bran muffins. Popcorn. Rye wafer crackers. Meats and other proteins Navy beans, kidney beans, and pinto beans. Soybeans. Split peas. Lentils. Nuts and seeds. Dairy Fiber-fortified yogurt. Beverages Fiber-fortified soy milk. Fiber-fortified orange juice. Other foods Fiber bars. The items listed above may not be a complete list of recommended foods and beverages. Contact a dietitian for more information. What foods should I avoid? Fruits Fruit juice. Cooked, strained fruit. Vegetables Fried potatoes. Canned vegetables. Well-cooked vegetables. Grains White bread. Pasta made with refined flour. White rice. Meats and other proteins Fatty cuts of meat. Fried chicken or fried fish. Dairy Milk. Yogurt. Cream cheese. Sour cream. Fats and oils Butters. Beverages Soft drinks. Other foods Cakes and pastries. The items listed above may not be a complete list of foods and beverages to avoid. Talk with your dietitian about what choices are best for you. Summary Fiber is a type of  carbohydrate. It is found in foods such as fruits, vegetables, whole grains, and beans. A high-fiber diet has many benefits. It can help to prevent constipation, lower blood cholesterol, aid weight loss, and reduce your risk of heart disease, diabetes, and certain cancers. Increase your intake of fiber gradually. Increasing fiber too quickly may cause cramping, bloating, and gas. Drink plenty of water while you increase the amount of fiber you consume. The best sources of fiber include whole fruits and vegetables, whole grains, nuts, seeds, and beans. This information is not intended to replace advice given to you by your health care provider. Make sure you discuss any questions you have with your health care provider. Document Revised: 05/21/2019 Document Reviewed: 05/21/2019 Elsevier Patient Education  [place].

## 2022-03-05 ENCOUNTER — Other Ambulatory Visit: Payer: Self-pay | Admitting: Cardiology

## 2022-03-05 DIAGNOSIS — R002 Palpitations: Secondary | ICD-10-CM

## 2022-04-16 ENCOUNTER — Encounter: Payer: Self-pay | Admitting: Podiatry

## 2022-04-16 ENCOUNTER — Ambulatory Visit (INDEPENDENT_AMBULATORY_CARE_PROVIDER_SITE_OTHER): Payer: BC Managed Care – PPO

## 2022-04-16 ENCOUNTER — Ambulatory Visit: Payer: BC Managed Care – PPO | Admitting: Podiatry

## 2022-04-16 DIAGNOSIS — M25571 Pain in right ankle and joints of right foot: Secondary | ICD-10-CM

## 2022-04-16 DIAGNOSIS — M205X9 Other deformities of toe(s) (acquired), unspecified foot: Secondary | ICD-10-CM | POA: Diagnosis not present

## 2022-04-16 DIAGNOSIS — M21619 Bunion of unspecified foot: Secondary | ICD-10-CM

## 2022-04-16 DIAGNOSIS — M7751 Other enthesopathy of right foot: Secondary | ICD-10-CM

## 2022-04-16 MED ORDER — TRIAMCINOLONE ACETONIDE 10 MG/ML IJ SUSP
10.0000 mg | Freq: Once | INTRAMUSCULAR | Status: AC
  Administered 2022-04-16: 10 mg

## 2022-04-16 MED ORDER — DICLOFENAC SODIUM 75 MG PO TBEC: 75.0000 mg | DELAYED_RELEASE_TABLET | Freq: Two times a day (BID) | ORAL | 2 refills | Status: DC

## 2022-04-17 ENCOUNTER — Other Ambulatory Visit: Payer: Self-pay | Admitting: Podiatry

## 2022-04-17 DIAGNOSIS — M21619 Bunion of unspecified foot: Secondary | ICD-10-CM

## 2022-04-17 DIAGNOSIS — M205X9 Other deformities of toe(s) (acquired), unspecified foot: Secondary | ICD-10-CM

## 2022-04-17 DIAGNOSIS — M7751 Other enthesopathy of right foot: Secondary | ICD-10-CM

## 2022-04-17 DIAGNOSIS — M25571 Pain in right ankle and joints of right foot: Secondary | ICD-10-CM

## 2022-04-18 NOTE — Progress Notes (Signed)
Subjective:   Patient ID: Debra Johnson, female   DOB: 52 y.o.   MRN: [id number]   HPI Patient has presented with a lot of pain in her right ankle times several months.  Does not remember specific injury and it is now aching quite a bit.  Patient does not smoke likes to be active   Review of Systems  All other systems reviewed and are negative.       Objective:  Physical Exam Vitals and nursing note reviewed.  Constitutional:      Appearance: She is well-developed.  Pulmonary:     Effort: Pulmonary effort is normal.  Musculoskeletal:        General: Normal range of motion.  Skin:    General: Skin is warm.  Neurological:     Mental Status: She is alert.     Neurovascular status intact muscle strength found to be adequate range of motion adequate with inflammation around the right sinus tarsi into the lateral ankle gutter with diminished motion of the subtalar joint right.  Patient is found to have good digital perfusion well-oriented x 3     Assessment:  Probability for subtalar joint inflammation fluid buildup which is most likely acute versus chronic     Plan:  H&P x-ray reviewed sterile prep and injected around the sinus tarsi 3 mg Kenalog 5 mg Xylocaine and into the lateral ankle gutter.  Tolerated well reappoint in several weeks and also applied a plantar fascial brace to reduce motion and inversion eversion leg  X-rays were negative for signs of arthritis or other bone pathology appears to be soft tissue

## 2022-04-30 ENCOUNTER — Ambulatory Visit: Payer: BC Managed Care – PPO | Admitting: Podiatry

## 2022-04-30 ENCOUNTER — Encounter: Payer: Self-pay | Admitting: Podiatry

## 2022-04-30 DIAGNOSIS — M7751 Other enthesopathy of right foot: Secondary | ICD-10-CM | POA: Diagnosis not present

## 2022-04-30 DIAGNOSIS — M25571 Pain in right ankle and joints of right foot: Secondary | ICD-10-CM

## 2022-05-02 NOTE — Progress Notes (Signed)
Subjective:   Patient ID: Debra Johnson, female   DOB: 52 y.o.   MRN: [id number]   HPI Patient states she is feeling a lot better and has reduction of the discomfort that she was experiencing.  States that pain is minimal and that she is walking with a good heel toe gait   ROS      Objective:  Physical Exam  Significant diminishment of discomfort in the plantar fascia still present upon deep palpation but overall doing much better     Assessment:  Acute plantar fasciitis still present but improving     Plan:  Reviewed condition recommended continuation of stretching exercises supportive shoes and patient will be seen back all questions answered today

## 2022-11-15 ENCOUNTER — Encounter: Payer: BC Managed Care – PPO | Admitting: Family Medicine

## 2022-12-12 ENCOUNTER — Other Ambulatory Visit (INDEPENDENT_AMBULATORY_CARE_PROVIDER_SITE_OTHER): Payer: BC Managed Care – PPO

## 2022-12-12 ENCOUNTER — Ambulatory Visit: Payer: BC Managed Care – PPO | Admitting: Family Medicine

## 2022-12-12 VITALS — BP 108/80 | HR 67 | Temp 98.2°F | Ht 64.0 in | Wt 140.3 lb

## 2022-12-12 DIAGNOSIS — R739 Hyperglycemia, unspecified: Secondary | ICD-10-CM | POA: Diagnosis not present

## 2022-12-12 DIAGNOSIS — Z0000 Encounter for general adult medical examination without abnormal findings: Secondary | ICD-10-CM

## 2022-12-12 DIAGNOSIS — Z13220 Encounter for screening for lipoid disorders: Secondary | ICD-10-CM | POA: Diagnosis not present

## 2022-12-12 DIAGNOSIS — Z23 Encounter for immunization: Secondary | ICD-10-CM | POA: Diagnosis not present

## 2022-12-12 LAB — CBC WITH DIFFERENTIAL/PLATELET
Basophils Absolute: 0 10*3/uL (ref 0.0–0.1)
Basophils Relative: 0.9 % (ref 0.0–3.0)
Eosinophils Absolute: 0.2 10*3/uL (ref 0.0–0.7)
Eosinophils Relative: 4.3 % (ref 0.0–5.0)
HCT: 45.5 % (ref 36.0–46.0)
Hemoglobin: 15.4 g/dL — ABNORMAL HIGH (ref 12.0–15.0)
Lymphocytes Relative: 29.6 % (ref 12.0–46.0)
Lymphs Abs: 1.5 10*3/uL (ref 0.7–4.0)
MCHC: 33.8 g/dL (ref 30.0–36.0)
MCV: 91.3 fL (ref 78.0–100.0)
Monocytes Absolute: 0.4 10*3/uL (ref 0.1–1.0)
Monocytes Relative: 8.2 % (ref 3.0–12.0)
Neutro Abs: 3 10*3/uL (ref 1.4–7.7)
Neutrophils Relative %: 57 % (ref 43.0–77.0)
Platelets: 201 10*3/uL (ref 150.0–400.0)
RBC: 4.98 Mil/uL (ref 3.87–5.11)
RDW: 12.7 % (ref 11.5–15.5)
WBC: 5.2 10*3/uL (ref 4.0–10.5)

## 2022-12-12 LAB — LIPID PANEL
Cholesterol: 232 mg/dL — ABNORMAL HIGH (ref 0–200)
HDL: 53.4 mg/dL (ref >39.00)
LDL Cholesterol: 161 mg/dL — ABNORMAL HIGH (ref 0–99)
NonHDL: 179.07
Total CHOL/HDL Ratio: 4
Triglycerides: 89 mg/dL (ref 0.0–149.0)
VLDL: 17.8 mg/dL (ref 0.0–40.0)

## 2022-12-12 LAB — COMPREHENSIVE METABOLIC PANEL
ALT: 13 U/L (ref 0–35)
AST: 14 U/L (ref 0–37)
Albumin: 4.7 g/dL (ref 3.5–5.2)
Alkaline Phosphatase: 120 U/L — ABNORMAL HIGH (ref 39–117)
BUN: 13 mg/dL (ref 6–23)
CO2: 29 meq/L (ref 19–32)
Calcium: 10 mg/dL (ref 8.4–10.5)
Chloride: 101 meq/L (ref 96–112)
Creatinine, Ser: 1.05 mg/dL (ref 0.40–1.20)
GFR: 60.64 mL/min (ref >60.00)
Glucose, Bld: 116 mg/dL — ABNORMAL HIGH (ref 70–99)
Potassium: 4.6 meq/L (ref 3.5–5.1)
Sodium: 138 meq/L (ref 135–145)
Total Bilirubin: 0.8 mg/dL (ref 0.2–1.2)
Total Protein: 7.3 g/dL (ref 6.0–8.3)

## 2022-12-12 NOTE — Patient Instructions (Signed)

## 2022-12-12 NOTE — Progress Notes (Signed)
Complete physical exam  Patient: Debra Johnson   DOB: 01/29/1971   52 y.o. Female  MRN: [id number]  Subjective:    Chief Complaint  Patient presents with   Annual Exam    Debra Johnson is a 52 y.o. female who presents today for a complete physical exam. She reports consuming a general diet. Home exercise routine includes walking 2 hrs per week. Gets 10,000 steps per day between her job and walking her dog. She generally feels well. She reports sleeping well. She does not have additional problems to discuss today.    Most recent fall risk assessment:     No data to display           Most recent depression screenings:    12/12/2022    8:17 AM 11/13/2021    9:24 AM  PHQ 2/9 Scores  PHQ - 2 Score 0 0  PHQ- 9 Score 0 1    Vision:Within last year and Dental: No current dental problems and Receives regular dental care  Patient Active Problem List   Diagnosis Date Noted   Healthy adult on routine physical examination 11/13/2021   Visit for routine gyn exam 11/13/2021   Elevated blood pressure reading 10/20/2021   Chronic low back pain without sciatica 10/20/2021   Irritable bowel 10/16/2021   Somatic dysfunction of spine, cervical 01/19/2021   Idiopathic scoliosis 12/05/2020   Scapular dyskinesis 12/05/2020   Migraine 09/28/2016   Murmur 03/03/2012   Heart palpitations 02/28/2012      Patient Care Team: Karie Georges, MD as PCP - General (Family Medicine) Jens Som Madolyn Frieze, MD as PCP - Cardiology (Cardiology)   Outpatient Medications Prior to Visit  Medication Sig   Acetaminophen (TYLENOL PO) Take by mouth as needed.   ibuprofen (ADVIL,MOTRIN) 200 MG tablet Take 200 mg by mouth every 6 (six) hours as needed for moderate pain.   metoprolol succinate (TOPROL-XL) 50 MG 24 hr tablet TAKE 1 TABLET BY MOUTH EVERY DAY   [DISCONTINUED] diclofenac (VOLTAREN) 75 MG EC tablet Take 1 tablet (75 mg total) by mouth 2 (two) times daily.   No facility-administered  medications prior to visit.    Review of Systems  HENT:  Negative for hearing loss.   Eyes:  Negative for blurred vision.  Respiratory:  Negative for shortness of breath.   Cardiovascular:  Negative for chest pain.  Gastrointestinal: Negative.   Genitourinary: Negative.   Musculoskeletal:  Negative for back pain.  Neurological:  Negative for headaches.  Psychiatric/Behavioral:  Negative for depression.        Objective:     BP 108/80 (BP Location: Left Arm, Patient Position: Sitting, Cuff Size: Normal)   Pulse 67   Temp 98.2 F (36.8 C) (Oral)   Ht 5\' 4"  (1.626 m)   Wt 140 lb 4.8 oz (63.6 kg)   SpO2 100%   BMI 24.08 kg/m    Physical Exam Vitals reviewed.  Constitutional:      Appearance: Normal appearance. She is well-groomed and normal weight.  HENT:     Right Ear: Tympanic membrane and ear canal normal.     Left Ear: Tympanic membrane and ear canal normal.     Mouth/Throat:     Mouth: Mucous membranes are moist.     Pharynx: No posterior oropharyngeal erythema.  Eyes:     Conjunctiva/sclera: Conjunctivae normal.  Neck:     Thyroid: No thyromegaly.  Cardiovascular:     Rate and Rhythm: Normal rate and regular  rhythm.     Pulses: Normal pulses.     Heart sounds: S1 normal and S2 normal.  Pulmonary:     Effort: Pulmonary effort is normal.     Breath sounds: Normal breath sounds and air entry.  Abdominal:     General: Abdomen is flat. Bowel sounds are normal.     Palpations: Abdomen is soft.  Musculoskeletal:     Right lower leg: No edema.     Left lower leg: No edema.  Lymphadenopathy:     Cervical: No cervical adenopathy.  Neurological:     Mental Status: She is alert and oriented to person, place, and time. Mental status is at baseline.     Gait: Gait is intact.  Psychiatric:        Mood and Affect: Mood and affect normal.        Speech: Speech normal.        Behavior: Behavior normal.        Judgment: Judgment normal.     No results found for any  visits on 12/12/22.     Assessment & Plan:    Routine Health Maintenance and Physical Exam  Immunization History  Administered Date(s) Administered   Influenza,inj,Quad PF,6+ Mos 11/05/2018, 11/09/2019, 12/09/2020, 11/13/2021   PFIZER(Purple Top)SARS-COV-2 Vaccination 03/27/2019, 04/17/2019   Td 06/30/1999   Tdap 11/20/2011   Zoster Recombinant(Shingrix) 11/09/2019, 03/04/2020    Health Maintenance  Topic Date Due   HIV Screening  Never done   DTaP/Tdap/Td (3 - Td or Tdap) 11/19/2021   INFLUENZA VACCINE  08/30/2022   COVID-19 Vaccine (3 - 2023-24 season) 12/28/2022 (Originally 09/30/2022)   MAMMOGRAM  09/23/2023   Cervical Cancer Screening (HPV/Pap Cotest)  11/14/2026   Colonoscopy  05/18/2027   Hepatitis C Screening  Completed   Zoster Vaccines- Shingrix  Completed   HPV VACCINES  Aged Out    Discussed health benefits of physical activity, and encouraged her to engage in regular exercise appropriate for her age and condition.  Lipid screening -     Lipid panel; Future  Routine general medical examination at a health care facility -     Comprehensive metabolic panel; Future -     CBC with Differential/Platelet; Future  Immunization due -     Tdap vaccine greater than or equal to 7yo IM  Normal physical exam findings today. Counseled patient on physical activity recommendations, healthy sleep habits, handouts given. RTC yearly or as needed. Reviewed HM, she is due for tdap today.  Return in 1 year (on 12/12/2023).     Karie Georges, MD

## 2022-12-17 ENCOUNTER — Ambulatory Visit: Payer: BC Managed Care – PPO | Admitting: Podiatry

## 2022-12-17 ENCOUNTER — Ambulatory Visit (INDEPENDENT_AMBULATORY_CARE_PROVIDER_SITE_OTHER): Payer: BC Managed Care – PPO

## 2022-12-17 ENCOUNTER — Encounter: Payer: Self-pay | Admitting: Podiatry

## 2022-12-17 DIAGNOSIS — M76821 Posterior tibial tendinitis, right leg: Secondary | ICD-10-CM | POA: Diagnosis not present

## 2022-12-17 DIAGNOSIS — M7751 Other enthesopathy of right foot: Secondary | ICD-10-CM | POA: Diagnosis not present

## 2022-12-17 MED ORDER — TRIAMCINOLONE ACETONIDE 10 MG/ML IJ SUSP
10.0000 mg | Freq: Once | INTRAMUSCULAR | Status: AC
  Administered 2022-12-17: 10 mg via INTRA_ARTICULAR

## 2022-12-17 NOTE — Progress Notes (Signed)
Subjective:   Patient ID: Debra Johnson, female   DOB: 52 y.o.   MRN: [id number]   HPI Patient states her right ankle is hurting her and it started mostly over the last few weeks even though she has had some issues with it since we had treated her in March.  Patient seems to possibly be hurting in a different area    ROS      Objective:  Physical Exam  Neurovascular status intact with quite a bit of discomfort in the posterior tibial tendon at its insertion into the navicular right with fluid buildup around the area and pain with palpation.     Assessment:  Posterior tibial tendinitis right which appears to be flared up mild ankle pain but appears to be more within the posterior tib     Plan:  Reviewed condition at this point I have recommended anti-inflammatories and I went ahead did a careful injection explaining risk 3 mg Dexasone Kenalog 5 mg Xylocaine applied air fracture walker to completely immobilize the foot and ankle and reappoint 4 weeks to reevaluate  X-rays were negative for signs of a acute fracture or pathology in that way

## 2022-12-18 ENCOUNTER — Ambulatory Visit: Payer: BC Managed Care – PPO | Admitting: Family Medicine

## 2022-12-18 ENCOUNTER — Encounter: Payer: Self-pay | Admitting: Family Medicine

## 2022-12-18 VITALS — BP 110/62 | HR 65 | Temp 97.8°F | Ht 64.0 in | Wt 140.4 lb

## 2022-12-18 DIAGNOSIS — J029 Acute pharyngitis, unspecified: Secondary | ICD-10-CM | POA: Diagnosis not present

## 2022-12-18 LAB — POC COVID19 BINAXNOW: SARS Coronavirus 2 Ag: NEGATIVE

## 2022-12-18 LAB — POCT RAPID STREP A (OFFICE): Rapid Strep A Screen: NEGATIVE

## 2022-12-18 NOTE — Progress Notes (Signed)
   Acute Office Visit  Subjective:     Patient ID: Debra Johnson, female    DOB: Sep 07, 1970, 52 y.o.   MRN: [id number]  Chief Complaint  Patient presents with   Sore Throat    X2 days, tried Tylenol with no relief    Sore Throat    Patient is in today for sore throat, started 3-4 days ago, states that yesterday she started with chills, fever, today is slightly better, no nasal congestion or ear pain, no SOB, no difficulty breathing or coughing.   Review of Systems  All other systems reviewed and are negative.       Objective:    BP 110/62 (BP Location: Left Arm, Patient Position: Sitting, Cuff Size: Normal)   Pulse 65   Temp 97.8 F (36.6 C) (Oral)   Ht 5\' 4"  (1.626 m)   Wt 140 lb 6.4 oz (63.7 kg)   SpO2 99%   BMI 24.10 kg/m    Physical Exam Vitals reviewed.  Constitutional:      Appearance: She is well-developed and normal weight.  HENT:     Right Ear: Tympanic membrane normal.     Left Ear: Tympanic membrane normal.     Mouth/Throat:     Mouth: Mucous membranes are moist.     Pharynx: Posterior oropharyngeal erythema present. No pharyngeal swelling or oropharyngeal exudate.     Tonsils: No tonsillar exudate. 1+ on the right. 1+ on the left.  Eyes:     Conjunctiva/sclera: Conjunctivae normal.  Cardiovascular:     Rate and Rhythm: Normal rate and regular rhythm.  Pulmonary:     Effort: Pulmonary effort is normal.     Breath sounds: Normal breath sounds. No wheezing.  Neurological:     Mental Status: She is alert.     Results for orders placed or performed in visit on 12/18/22  POC COVID-19  Result Value Ref Range   SARS Coronavirus 2 Ag Negative Negative  POC Rapid Strep A  Result Value Ref Range   Rapid Strep A Screen Negative Negative        Assessment & Plan:   Problem List Items Addressed This Visit   None Visit Diagnoses     Sore throat    -  Primary   Relevant Orders   POC COVID-19 (Completed)   POC Rapid Strep A (Completed)    Acute viral pharyngitis         Most likely viral, her covid and strep tests were both negative. I recommended OTC medications to help with throat pain and other URI symptoms. Advised to return to office if her symptoms do not improve.   No orders of the defined types were placed in this encounter.   No follow-ups on file.  Karie Georges, MD

## 2022-12-24 NOTE — Addendum Note (Signed)
Addended by: Karie Georges on: 12/24/2022 10:07 AM   Modules accepted: Orders

## 2022-12-26 ENCOUNTER — Other Ambulatory Visit: Payer: BC Managed Care – PPO

## 2022-12-26 LAB — HEMOGLOBIN A1C: Hgb A1c MFr Bld: 5.8 % (ref 4.6–6.5)

## 2023-01-14 ENCOUNTER — Ambulatory Visit: Payer: BC Managed Care – PPO | Admitting: Podiatry

## 2023-01-14 ENCOUNTER — Encounter: Payer: Self-pay | Admitting: Podiatry

## 2023-01-14 DIAGNOSIS — M76821 Posterior tibial tendinitis, right leg: Secondary | ICD-10-CM | POA: Diagnosis not present

## 2023-01-16 NOTE — Progress Notes (Signed)
Subjective:   Patient ID: Debra Johnson, female   DOB: 52 y.o.   MRN: [id number]   HPI Patient presents still having pain but I am improved with utilization of the boot   ROS      Objective:  Physical Exam  Neurovascular status intact with inflammation around the posterior tibial tendon right fluid buildup noted with pain     Assessment:  Posterior tibial tendinitis right improved but still mildly sore with depression of the arch     Plan:  H&P reviewed and discussed further treatment including the importance of stable supportive shoes and I did dispense a power step like insert to wear at the current time.  Patient will be seen back to recheck

## 2023-01-24 NOTE — Progress Notes (Signed)
 HPI: FU palpitations. Echocardiogram July 2019 showed normal LV function, mild mitral regurgitation.  Monitor July 2019 showed sinus rhythm with rare PAC and PVC.  Since I last saw her, she denies dyspnea, chest pain or syncope.  She continues to have occasional palpitations unchanged described as a skip.  They are not sustained.  Current Outpatient Medications  Medication Sig Dispense Refill   Acetaminophen (TYLENOL PO) Take by mouth as needed.     ibuprofen (ADVIL,MOTRIN) 200 MG tablet Take 200 mg by mouth every 6 (six) hours as needed for moderate pain.     metoprolol  succinate (TOPROL -XL) 50 MG 24 hr tablet TAKE 1 TABLET BY MOUTH EVERY DAY 90 tablet 3   No current facility-administered medications for this visit.     Past Medical History:  Diagnosis Date   Chicken pox    Headache(784.0)    frequent   IBS (irritable bowel syndrome)    Infertility, female    Murmur    Palpitations    on meds   Scoliosis     Past Surgical History:  Procedure Laterality Date   IVF treatment     WISDOM TOOTH EXTRACTION     WISDOM TOOTH EXTRACTION      Social History   Socioeconomic History   Marital status: Married    Spouse name: Not on file   Number of children: 2   Years of education: Not on file   Highest education level: Bachelor's degree (e.g., BA, AB, BS)  Occupational History    Comment: First grade teacher  Tobacco Use   Smoking status: Never   Smokeless tobacco: Never  Vaping Use   Vaping status: Never Used  Substance and Sexual Activity   Alcohol use: No   Drug use: No   Sexual activity: Yes  Other Topics Concern   Not on file  Social History Narrative          Work or School: [employer]              Home Situation: has two adopted children             Spiritual Beliefs: methodist              Lifestyle: no regular exercise, heathy diet               Social Drivers of Health   Financial Resource Strain: Low Risk  (12/12/2022)   Overall  Financial Resource Strain (CARDIA)    Difficulty of Paying Living Expenses: Not hard at all  Food Insecurity: No Food Insecurity (12/12/2022)   Hunger Vital Sign    Worried About Running Out of Food in the Last Year: Never true    Ran Out of Food in the Last Year: Never true  Transportation Needs: No Transportation Needs (12/12/2022)   PRAPARE - Administrator, Civil Service (Medical): No    Lack of Transportation (Non-Medical): No  Physical Activity: Sufficiently Active (12/12/2022)   Exercise Vital Sign    Days of Exercise per Week: 5 days    Minutes of Exercise per Session: 30 min  Stress: No Stress Concern Present (12/12/2022)   [employer] of Occupational Health - Occupational Stress Questionnaire    Feeling of Stress : Only a little  Social Connections: Socially Integrated (12/12/2022)   Social Connection and Isolation Panel [NHANES]    Frequency of Communication with Friends and Family: Three times a week    Frequency of Social Gatherings  with Friends and Family: Once a week    Attends Religious Services: More than 4 times per year    Active Member of Clubs or Organizations: Yes    Attends Engineer, Structural: More than 4 times per year    Marital Status: Married  Catering Manager Violence: Not on file    Family History  Problem Relation Age of Onset   Asthma Mother    Healthy Father    Colon cancer Paternal Grandfather    Colon polyps Paternal Grandfather 37   Lung cancer Maternal Grandfather    Allergies Sister    Asthma Sister    Healthy Brother    Dementia Paternal Grandmother    Esophageal cancer Neg Hx    Rectal cancer Neg Hx    Stomach cancer Neg Hx     ROS: no fevers or chills, productive cough, hemoptysis, dysphasia, odynophagia, melena, hematochezia, dysuria, hematuria, rash, seizure activity, orthopnea, PND, pedal edema, claudication. Remaining systems are negative.  Physical Exam: Well-developed well-nourished in no acute  distress.  Skin is warm and dry.  HEENT is normal.  Neck is supple.  Chest is clear to auscultation with normal expansion.  Cardiovascular exam is regular rate and rhythm.  Abdominal exam nontender or distended. No masses palpated. Extremities show no edema. neuro grossly intact  EKG Interpretation Date/Time:  Tuesday February 05 2023 15:11:51 EST Ventricular Rate:  64 PR Interval:  132 QRS Duration:  74 QT Interval:  350 QTC Calculation: 361 R Axis:   47  Text Interpretation: Normal sinus rhythm Nonspecific T wave abnormality No previous ECGs available Confirmed by Pietro Rogue ([id number]) on 02/05/2023 3:13:48 PM    A/P  1 palpitations-this is felt secondary to PVCs and PACs.  Continue beta-blocker at present dose.  Symptoms are reasonably well-controlled.  2 history of hyperlipidemia-no history of coronary or vascular disease.  Continue diet.  Rogue Pietro, MD

## 2023-02-05 ENCOUNTER — Encounter: Payer: Self-pay | Admitting: Cardiology

## 2023-02-05 ENCOUNTER — Ambulatory Visit: Payer: Self-pay | Attending: Cardiology | Admitting: Cardiology

## 2023-02-05 VITALS — BP 128/80 | HR 64 | Ht 64.0 in | Wt 140.0 lb

## 2023-02-05 DIAGNOSIS — R002 Palpitations: Secondary | ICD-10-CM | POA: Diagnosis not present

## 2023-02-05 DIAGNOSIS — E7800 Pure hypercholesterolemia, unspecified: Secondary | ICD-10-CM

## 2023-02-05 MED ORDER — METOPROLOL SUCCINATE ER 50 MG PO TB24: 50.0000 mg | ORAL_TABLET | Freq: Every day | ORAL | 3 refills | Status: DC

## 2023-02-05 NOTE — Patient Instructions (Signed)

## 2023-02-18 ENCOUNTER — Ambulatory Visit: Payer: Self-pay | Admitting: Podiatry

## 2023-02-18 ENCOUNTER — Encounter: Payer: Self-pay | Admitting: Podiatry

## 2023-02-18 DIAGNOSIS — M76821 Posterior tibial tendinitis, right leg: Secondary | ICD-10-CM

## 2023-02-19 NOTE — Progress Notes (Signed)
Subjective:   Patient ID: Debra Johnson, female   DOB: 53 y.o.   MRN: [id number]   HPI Patient states that her foot seems to be improved quite a bit and she did switch from shoe gear which seem to make a difference neuro   ROS      Objective:  Physical Exam  Ocular status intact inflammation pain that has resolved for the most part around the posterior tibial insertion into the navicular right doing     Assessment:  Well post treatment for posterior tibial tendinitis right     Plan:  H&P reviewed and recommended the continuation of support shoes inserts and ice and topical anti-inflammatories.  Reappoint as symptoms indicate may require other treatment depending on response

## 2023-05-09 ENCOUNTER — Ambulatory Visit: Payer: BC Managed Care – PPO | Admitting: Family Medicine

## 2023-10-25 IMAGING — DX DG CERVICAL SPINE 2 OR 3 VIEWS
3 series · 3 of 3 positions shown · non-contrast
Comparison: None.

CLINICAL DATA: Neck pain

EXAM:
CERVICAL SPINE - 2-3 VIEW

[c-spine lat]
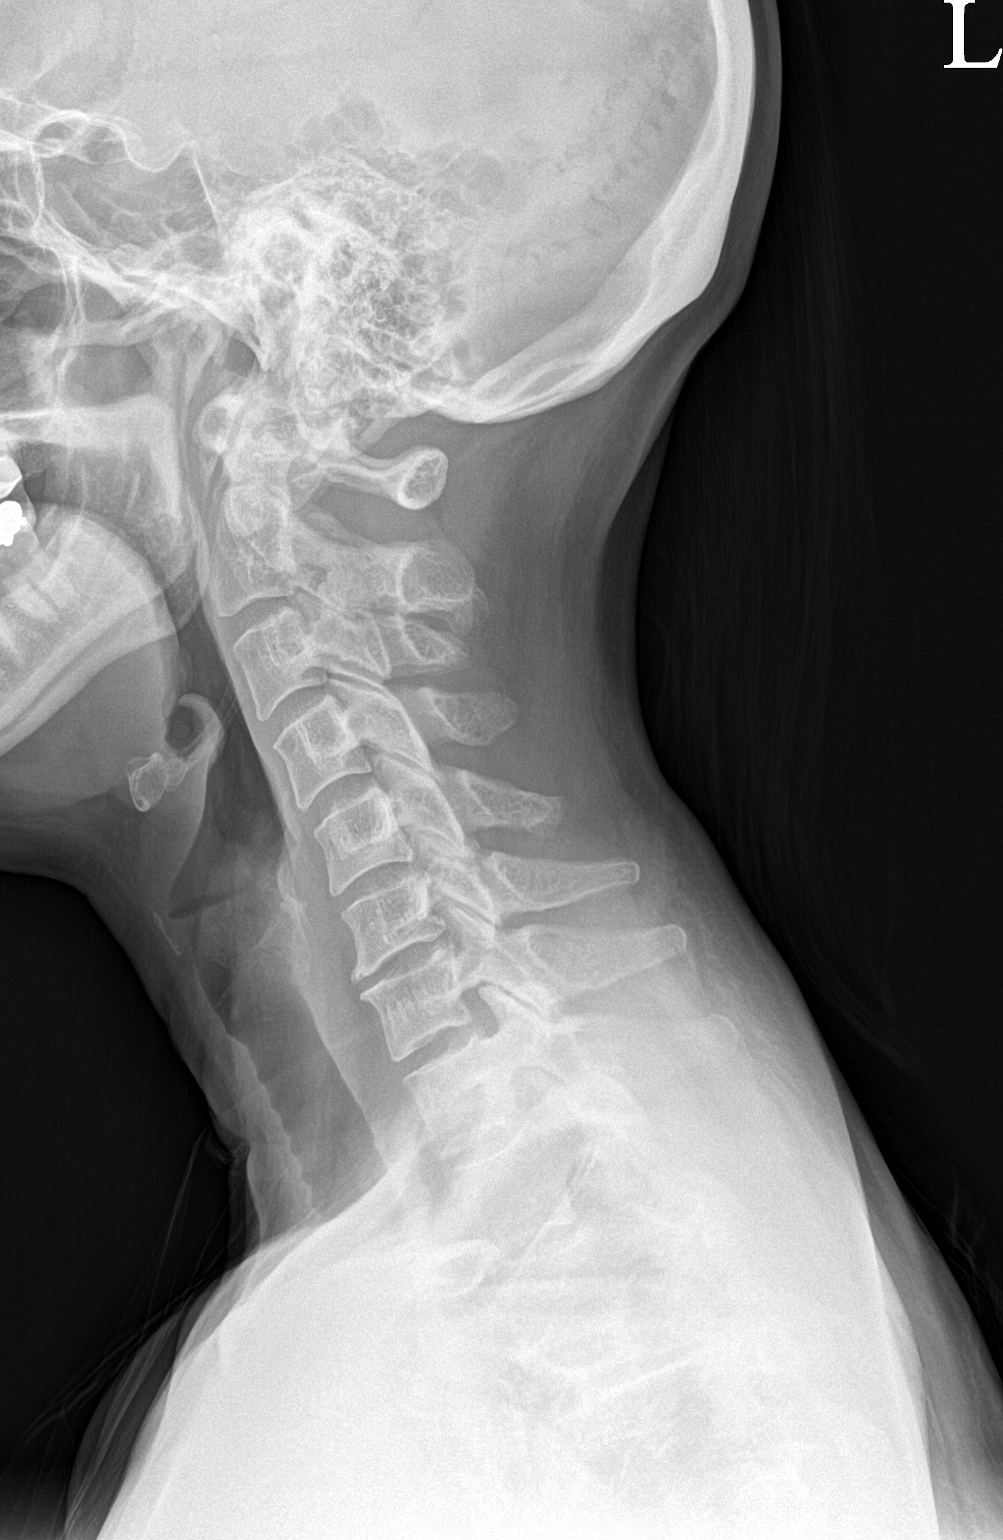

[c-spine ap]
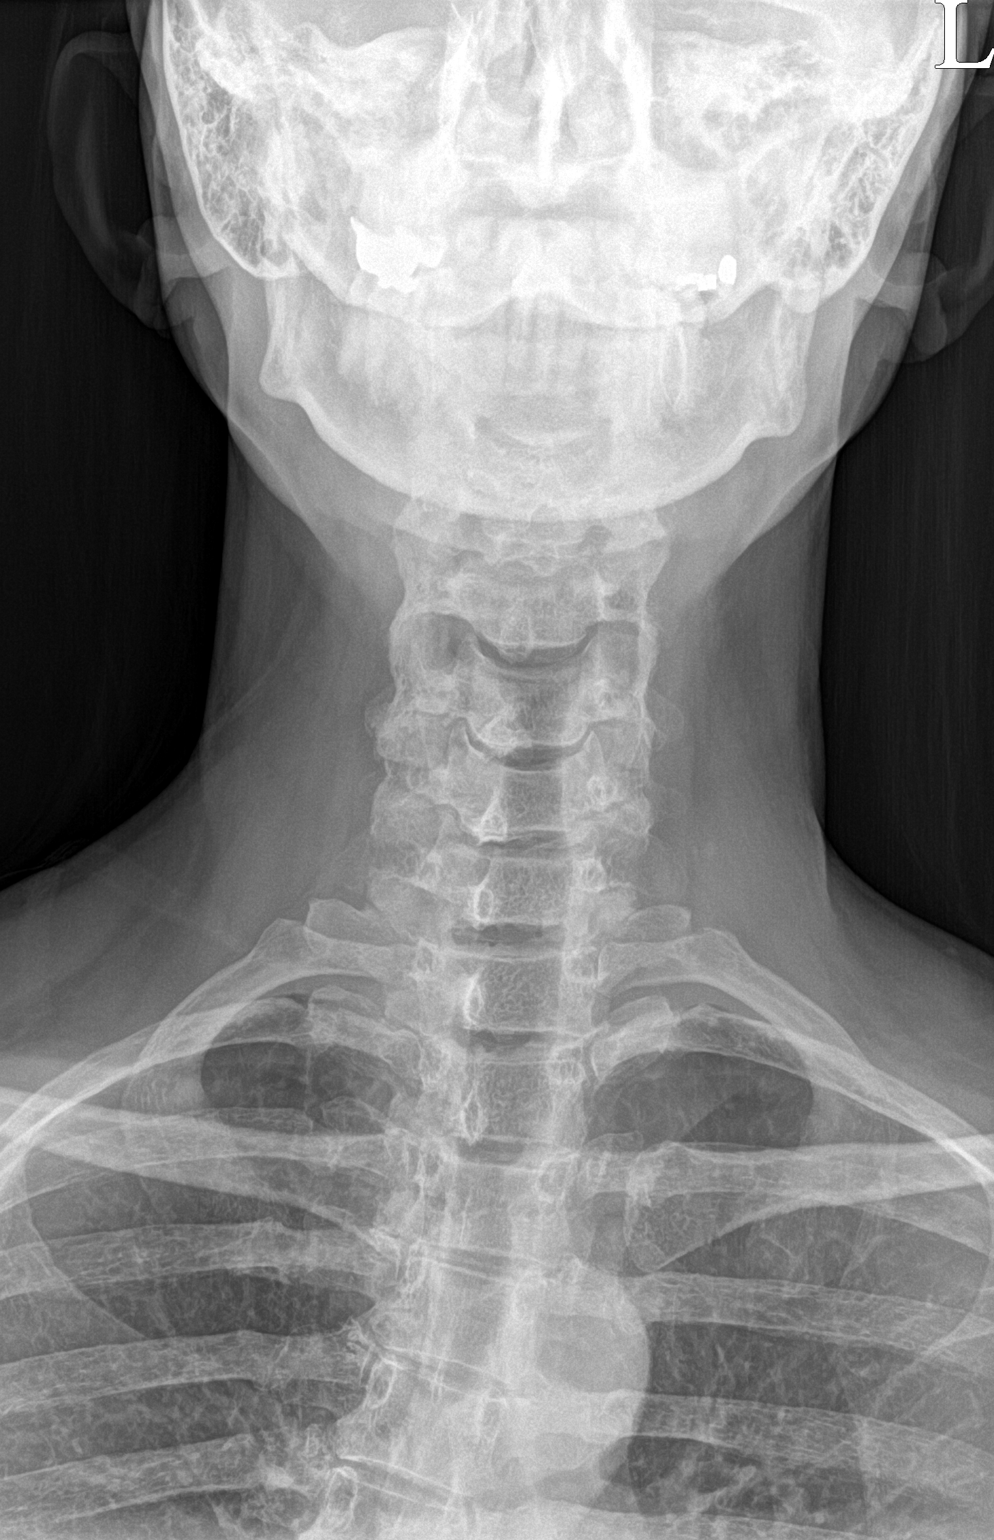

[c-spine open mouth]
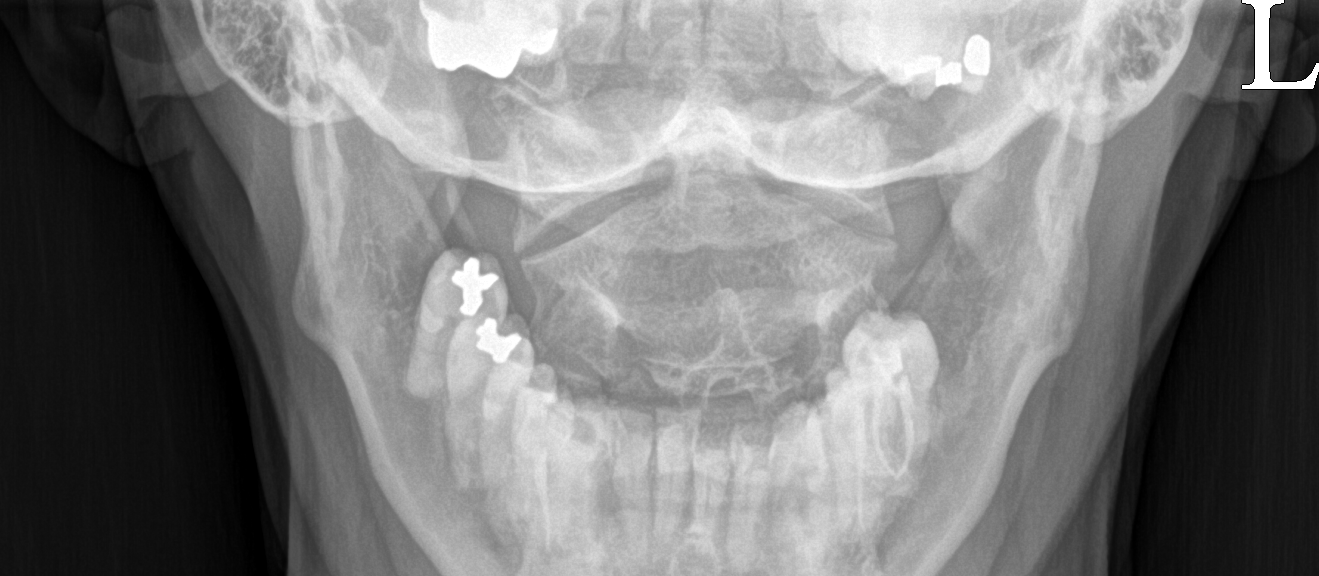

[3 of 3 positions shown; findings below may reference images not displayed]

FINDINGS: No recent fracture is seen. Alignment of posterior margins of
vertebral bodies is within normal limits. Degenerative changes are
noted with bony spurs and facet hypertrophy at C5-C6 and C6-C7
levels. In the AP view, there is possible encroachment of neural
foramina at C5-C6 and C6-C7 levels. Evaluation of neural foramina is
limited without oblique views. Prevertebral soft tissues are
unremarkable.
IMPRESSION: No recent fracture is seen. Cervical spondylosis at C5-C6 and C6-C7
levels with possible encroachment of neural foramina.

## 2023-12-13 ENCOUNTER — Ambulatory Visit (INDEPENDENT_AMBULATORY_CARE_PROVIDER_SITE_OTHER): Admitting: Family Medicine

## 2023-12-13 ENCOUNTER — Encounter: Payer: Self-pay | Admitting: Family Medicine

## 2023-12-13 VITALS — BP 108/60 | HR 67 | Temp 98.4°F | Ht 64.5 in | Wt 142.0 lb

## 2023-12-13 DIAGNOSIS — Z1231 Encounter for screening mammogram for malignant neoplasm of breast: Secondary | ICD-10-CM | POA: Diagnosis not present

## 2023-12-13 DIAGNOSIS — Z0000 Encounter for general adult medical examination without abnormal findings: Secondary | ICD-10-CM | POA: Diagnosis not present

## 2023-12-13 DIAGNOSIS — R739 Hyperglycemia, unspecified: Secondary | ICD-10-CM

## 2023-12-13 DIAGNOSIS — Z13220 Encounter for screening for lipoid disorders: Secondary | ICD-10-CM | POA: Diagnosis not present

## 2023-12-13 LAB — COMPREHENSIVE METABOLIC PANEL WITH GFR
ALT: 13 U/L (ref 0–35)
AST: 14 U/L (ref 0–37)
Albumin: 4.5 g/dL (ref 3.5–5.2)
Alkaline Phosphatase: 106 U/L (ref 39–117)
BUN: 12 mg/dL (ref 6–23)
CO2: 29 meq/L (ref 19–32)
Calcium: 9.6 mg/dL (ref 8.4–10.5)
Chloride: 104 meq/L (ref 96–112)
Creatinine, Ser: 1.01 mg/dL (ref 0.40–1.20)
GFR: 63.09 mL/min (ref >60.00)
Glucose, Bld: 116 mg/dL — ABNORMAL HIGH (ref 70–99)
Potassium: 4.1 meq/L (ref 3.5–5.1)
Sodium: 142 meq/L (ref 135–145)
Total Bilirubin: 0.7 mg/dL (ref 0.2–1.2)
Total Protein: 7 g/dL (ref 6.0–8.3)

## 2023-12-13 LAB — LIPID PANEL
Cholesterol: 201 mg/dL — ABNORMAL HIGH (ref 0–200)
HDL: 51.7 mg/dL (ref >39.00)
LDL Cholesterol: 132 mg/dL — ABNORMAL HIGH (ref 0–99)
NonHDL: 149.51
Total CHOL/HDL Ratio: 4
Triglycerides: 89 mg/dL (ref 0.0–149.0)
VLDL: 17.8 mg/dL (ref 0.0–40.0)

## 2023-12-13 NOTE — Progress Notes (Signed)
 Complete physical exam  Patient: Debra Johnson   DOB: 20-Nov-1970   52 y.o. Female  MRN: [id number]  Subjective:    Chief Complaint  Patient presents with   Annual Exam    Debra Johnson is a 53 y.o. female who presents today for a complete physical exam. She reports consuming a regular diet. Does eat veggies, usually eats them daily, eats good sources of protein, occasional red meat, drinks milk in cereal, eats cheese and yogurt. Cooks a lot, doesn't necessarily consume boxed or processed foods. Only eats out once a week. Home exercise routine includes walking 2 hrs per week. She generally feels well. She reports sleeping fairly well. Occasionally has trouble falling asleep. She does not have additional problems to discuss today.    Most recent fall risk assessment:     No data to display           Most recent depression screenings:    12/12/2022    8:17 AM 11/13/2021    9:24 AM  PHQ 2/9 Scores  PHQ - 2 Score 0 0  PHQ- 9 Score 0  1      Data saved with a previous flowsheet row definition    Vision:Within last year and Dental: No current dental problems and Receives regular dental care  Patient Active Problem List   Diagnosis Date Noted   Healthy adult on routine physical examination 11/13/2021   Visit for routine gyn exam 11/13/2021   Elevated blood pressure reading 10/20/2021   Chronic low back pain without sciatica 10/20/2021   Irritable bowel 10/16/2021   Somatic dysfunction of spine, cervical 01/19/2021   Idiopathic scoliosis 12/05/2020   Scapular dyskinesis 12/05/2020   Migraine 09/28/2016   Murmur 03/03/2012   Heart palpitations 02/28/2012      Patient Care Team: Ozell Heron HERO, MD as PCP - General (Family Medicine) Pietro Redell RAMAN, MD as PCP - Cardiology (Cardiology)   Outpatient Medications Prior to Visit  Medication Sig   Acetaminophen (TYLENOL PO) Take by mouth as needed.   ibuprofen (ADVIL,MOTRIN) 200 MG tablet Take 200 mg by mouth  every 6 (six) hours as needed for moderate pain.   metoprolol  succinate (TOPROL -XL) 50 MG 24 hr tablet Take 1 tablet (50 mg total) by mouth daily.   No facility-administered medications prior to visit.    Review of Systems  HENT:  Negative for hearing loss.   Eyes:  Negative for blurred vision.  Respiratory:  Negative for shortness of breath.   Cardiovascular:  Negative for chest pain.  Gastrointestinal: Negative.   Genitourinary: Negative.   Musculoskeletal:  Negative for back pain.  Neurological:  Negative for headaches.  Psychiatric/Behavioral:  Negative for depression.        Objective:     BP 108/60   Pulse 67   Temp 98.4 F (36.9 C) (Oral)   Ht 5' 4.5 (1.638 m)   Wt 142 lb (64.4 kg)   LMP 05/30/2022 (Approximate)   SpO2 97%   BMI 24.00 kg/m    Physical Exam Vitals reviewed.  Constitutional:      Appearance: Normal appearance. She is well-groomed and normal weight.  HENT:     Right Ear: Tympanic membrane and ear canal normal.     Left Ear: Tympanic membrane and ear canal normal.     Mouth/Throat:     Mouth: Mucous membranes are moist.     Pharynx: No posterior oropharyngeal erythema.  Eyes:     Conjunctiva/sclera: Conjunctivae normal.  Neck:     Thyroid : No thyromegaly.  Cardiovascular:     Rate and Rhythm: Normal rate and regular rhythm.     Pulses: Normal pulses.     Heart sounds: S1 normal and S2 normal.  Pulmonary:     Effort: Pulmonary effort is normal.     Breath sounds: Normal breath sounds and air entry.  Abdominal:     General: Abdomen is flat. Bowel sounds are normal.     Palpations: Abdomen is soft.  Musculoskeletal:     Right lower leg: No edema.     Left lower leg: No edema.  Lymphadenopathy:     Cervical: No cervical adenopathy.  Neurological:     Mental Status: She is alert and oriented to person, place, and time. Mental status is at baseline.     Gait: Gait is intact.  Psychiatric:        Mood and Affect: Mood and affect normal.         Speech: Speech normal.        Behavior: Behavior normal.        Judgment: Judgment normal.      No results found for any visits on 12/13/23.     Assessment & Plan:    Routine Health Maintenance and Physical Exam  Immunization History  Administered Date(s) Administered   Influenza,inj,Quad PF,6+ Mos 11/05/2018, 11/09/2019, 12/09/2020, 11/13/2021   PFIZER(Purple Top)SARS-COV-2 Vaccination 03/27/2019, 04/17/2019   Td 06/30/1999   Tdap 11/20/2011, 12/12/2022   Zoster Recombinant(Shingrix) 11/09/2019, 03/04/2020    Health Maintenance  Topic Date Due   Hepatitis B Vaccines 19-59 Average Risk (1 of 3 - 19+ 3-dose series) Never done   Pneumococcal Vaccine: 50+ Years (1 of 1 - PCV) Never done   Mammogram  09/23/2023   COVID-19 Vaccine (3 - 2025-26 season) 09/30/2023   Influenza Vaccine  04/28/2024 (Originally 08/30/2023)   HIV Screening  12/12/2024 (Originally 05/23/1984)   Cervical Cancer Screening (HPV/Pap Cotest)  11/14/2026   Colonoscopy  05/18/2027   DTaP/Tdap/Td (4 - Td or Tdap) 12/11/2032   Hepatitis C Screening  Completed   Zoster Vaccines- Shingrix  Completed   HPV VACCINES  Aged Out   Meningococcal B Vaccine  Aged Out    Discussed health benefits of physical activity, and encouraged her to engage in regular exercise appropriate for her age and condition.  Lipid screening -     Lipid panel; Future  Hyperglycemia -     Hemoglobin A1c; Future  Routine general medical examination at a health care facility -     Comprehensive metabolic panel with GFR; Future  Breast cancer screening by mammogram -     3D Screening Mammogram, Left and Right; Future  General physical exam findings are normal today. I reviewed the patient's preventative testing, immunizations, and lifestyle habits. I made appropriate recommendations and placed orders for the appropriate tests and/or vaccinations. I counseled the patient on the CDC's recommendations for healthy exercise and diet. I  counseled the patient on healthy sleep habits and stress management. Handouts to reinforce the counseling were given at the conclusion of the visit.    Return in 1 year (on 12/12/2024) for annual physical exam.     Heron CHRISTELLA Sharper, MD

## 2023-12-13 NOTE — Patient Instructions (Signed)

## 2023-12-16 ENCOUNTER — Encounter: Payer: Self-pay | Admitting: Family Medicine

## 2023-12-16 ENCOUNTER — Ambulatory Visit: Payer: Self-pay | Admitting: Family Medicine

## 2023-12-16 LAB — HEMOGLOBIN A1C: Hgb A1c MFr Bld: 5.7 % (ref 4.6–6.5)

## 2024-01-07 ENCOUNTER — Ambulatory Visit
Admission: RE | Admit: 2024-01-07 | Discharge: 2024-01-07 | Disposition: A | Source: Ambulatory Visit | Attending: Family Medicine

## 2024-01-07 DIAGNOSIS — Z1231 Encounter for screening mammogram for malignant neoplasm of breast: Secondary | ICD-10-CM

## 2024-01-25 ENCOUNTER — Other Ambulatory Visit: Payer: Self-pay | Admitting: Cardiology

## 2024-01-25 DIAGNOSIS — R002 Palpitations: Secondary | ICD-10-CM

## 2024-02-05 ENCOUNTER — Encounter: Payer: Self-pay | Admitting: Cardiology

## 2024-02-06 ENCOUNTER — Encounter: Payer: Self-pay | Admitting: Cardiology

## 2024-03-19 ENCOUNTER — Ambulatory Visit: Admitting: Cardiology

## 2024-12-14 ENCOUNTER — Encounter: Admitting: Family Medicine
